# Patient Record
Sex: Female | Born: 1980 | Race: Black or African American | Hispanic: No | Marital: Married | Smoking: Never smoker
Health system: Southern US, Community
[De-identification: ages and names within clinical notes are randomized; demographics above are authoritative.]

## PROBLEM LIST (undated history)

## (undated) DIAGNOSIS — M419 Scoliosis, unspecified: Secondary | ICD-10-CM

## (undated) DIAGNOSIS — D649 Anemia, unspecified: Secondary | ICD-10-CM

## (undated) HISTORY — PX: DILATION AND CURETTAGE OF UTERUS: SHX78

## (undated) HISTORY — PX: BACK SURGERY: SHX140

---

## 2010-03-14 ENCOUNTER — Inpatient Hospital Stay (HOSPITAL_COMMUNITY): Admission: AD | Admit: 2010-03-14 | Discharge: 2010-03-18 | Payer: Self-pay | Admitting: Obstetrics and Gynecology

## 2010-10-28 LAB — CBC
HCT: 36.5 % (ref 36.0–46.0)
Hemoglobin: 12.2 g/dL (ref 12.0–15.0)
Hemoglobin: 9.9 g/dL — ABNORMAL LOW (ref 12.0–15.0)
MCH: 30.3 pg (ref 26.0–34.0)
MCV: 88.6 fL (ref 78.0–100.0)
MCV: 89.5 fL (ref 78.0–100.0)
RBC: 3.27 MIL/uL — ABNORMAL LOW (ref 3.87–5.11)
RBC: 4.12 MIL/uL (ref 3.87–5.11)
WBC: 5.9 10*3/uL (ref 4.0–10.5)

## 2017-10-02 ENCOUNTER — Other Ambulatory Visit: Payer: Self-pay

## 2017-10-05 ENCOUNTER — Other Ambulatory Visit (HOSPITAL_COMMUNITY): Payer: Self-pay | Admitting: Obstetrics and Gynecology

## 2017-10-11 ENCOUNTER — Ambulatory Visit (HOSPITAL_COMMUNITY)
Admission: RE | Admit: 2017-10-11 | Discharge: 2017-10-11 | Disposition: A | Discharge status: Home / Self Care | Payer: BC Managed Care – PPO | Source: Ambulatory Visit | Attending: Obstetrics and Gynecology | Admitting: Obstetrics and Gynecology

## 2017-10-11 ENCOUNTER — Encounter (HOSPITAL_COMMUNITY): Payer: Self-pay | Admitting: *Deleted

## 2017-10-11 DIAGNOSIS — O09529 Supervision of elderly multigravida, unspecified trimester: Secondary | ICD-10-CM

## 2017-10-11 HISTORY — DX: Scoliosis, unspecified: M41.9

## 2017-10-11 HISTORY — DX: Anemia, unspecified: D64.9

## 2017-10-15 DIAGNOSIS — O09529 Supervision of elderly multigravida, unspecified trimester: Secondary | ICD-10-CM | POA: Insufficient documentation

## 2017-10-15 DIAGNOSIS — Z3A15 15 weeks gestation of pregnancy: Secondary | ICD-10-CM | POA: Insufficient documentation

## 2017-10-15 NOTE — Progress Notes (Signed)
Genetic Counseling  High-Risk Gestation Note  Appointment Date:  10/15/2017 Referred By: Servando Salina, MD Date of Birth:  1981/02/23 Partner:  Savannah Hart.    Pregnancy History: G3P1011 Estimated Date of Delivery: 04/01/18 Estimated Gestational Age: [redacted]w[redacted]d Attending: Renella Cunas, MD   Savannah Hart and her husband, Mr. Zettie Gootee., were seen for genetic counseling because of a maternal age of 37 y.o. and reported NIPS high risk due to low fetal fraction.    In summary:  Discussed AMA and associated risk for fetal aneuploidy  NIPS ([facility] through [place]) identified low fetal fraction  1 in 17 risk for underlying fetal aneuploidy (T18, T13, Triploidy) according to [employer] al (2018)  Specific analysis for aneuploidy in current pregnancy not performed on cell free DNA given low fetal fraction  Patient had First screen which was within normal limits  Reduced fetal Down syndrome risk to 1 in 632  Reduced fetal T18/13 risk to 1 in >10,000  Reviewed differences in sensitivity and specificity of First screen and NIPS  Discussed options for screening  NIPS repeat to same laboratory given that fetal fraction increases with increasing gestational age- couple declined  Ultrasound in second trimester  Discussed diagnostic testing options  Amniocentesis- declined  Reviewed family history concerns  They were counseled regarding maternal age and the association with risk for chromosome conditions due to nondisjunction with aging of the ova.   We reviewed chromosomes, nondisjunction, and the associated risk for fetal aneuploidy related to a maternal age of 37 y.o. at [redacted]w[redacted]d gestation.  They were counseled that the risk for aneuploidy decreases as gestational age increases, accounting for those pregnancies which spontaneously abort.  We specifically discussed Down syndrome (trisomy 89), trisomies 74 and 74, and sex chromosome aneuploidies (47,XXX and 47,XXY)  including the common features and prognoses of each.   Savannah Hart previously had noninvasive prenatal screening (NIPS)/prenatal cell free DNA testing performed through her OB provider. This screening, specifically [facility] through [facility] laboratory identified low fetal fraction (3.9%), which is reported to be association with an increased risk for underlying fetal aneuploidy in pregnancy. Specifically, Johnsie Hart reports an associated 1 in 17 risk for underlying fetal aneuploidy (trisomy 63, trisomy 46, or triploidy). We reviewed the methodology of NIPS and discussed differential diagnoses for low fetal fraction including obesity, early gestational age, physiological differences in maternal blood, physiological differences in the placenta, and underlying fetal aneuploidy. Additionally, we discussed that there may be additional factors that impact fetal fraction in pregnancy that are not yet reported in the medical literature. When fetal fraction is on average less than 4%, [facility] has in recent years not been able to perform risk assessment for fetal aneuploidy conditions.  Recent outcome study data from pregnancies with low fetal fraction from [facility] laboratory (McKanna et al 2018), when not expected to be low based on maternal weight or gestational age, identified an increased risk for underlying fetal triploidy, trisomy 63, or trisomy 43, with a positive predictive value of approximately 1 in 63.   Savannah Hart had First trimester screening performed through her OB provider, which was within normal range for the conditions screened. We reviewed the associated reduction in risks for fetal Down syndrome (1 in 172 to 1 in 632) and Trisomy 18/13 (1 in 304 to 1 in >10,000). We reviewed that screens are used to modify a patient's a priori risk for aneuploidy, typically based on age in order to provide a pregnancy specific risk assessment. We reviewed the  sensitivity and specificity of First screen. They  understand that this screen is not diagnostic and does not assess for all chromosome conditions.   We reviewed additional available screening options including repeat NIPS and detailed ultrasound. We also reviewed the option of repeat NIPS given that fetal fraction is typically expected to increase throughout gestation, though discussed the possibility of not obtaining a result due to low fetal fraction. We reviewed the benefits and limitations of each option. Specifically, we discussed the conditions for which each test screens, the detection rates, and false positive rates of each. She was also counseled regarding diagnostic testing via amniocentesis. We reviewed the approximate 1 in 160-737 risk for complications from amniocentesis, including spontaneous pregnancy loss. We discussed the possible results that the tests might provide including: positive, negative, unanticipated, and no result. Finally, they were counseled regarding the cost of each option and potential out of pocket expenses.   After consideration of all the options, Savannah Hart declined amniocentesis, stating that they would not elect to pursue this testing in pregnancy given the associated risk for complications, and they declined repeat NIPS at this time. Detailed ultrasound is planned to be performed through her OB provider. She understands that screening tests cannot rule out all birth defects or genetic syndromes. The patient was advised of this limitation and states she still does not want additional testing at this time.    Savannah Hart was provided with written information regarding cystic fibrosis (CF), spinal muscular atrophy (SMA) and hemoglobinopathies including the carrier frequency, availability of carrier screening and prenatal diagnosis if indicated.  In addition, we discussed that CF and hemoglobinopathies are routinely screened for as part of the [place] newborn screening panel.  After further discussion, she  declined screening for CF, SMA and hemoglobinopathies.  Both family histories were reviewed and found to be contributory for a paternal half-uncle to Mr. Chevere (his father's maternal half-brother) with mild intellectual disability of unknown etiology. He reportedly was married and employed. He was not described to have dysmorphic features, and he died in his late 53's due to pneumonia. No additional relatives were reported with intellectual disability. They were counseled that there are many different causes of intellectual disabilities including environmental, multifactorial, and genetic etiologies.  We discussed that a specific diagnosis for intellectual disability can be determined in approximately 50% of these individuals.  In the remaining 50% of individuals, a diagnosis may never be determined.  Regarding genetic causes, we discussed that chromosome aberrations (aneuploidy, deletions, duplications, insertions, and translocations) are responsible for a small percentage of individuals with intellectual disability.  Many individuals with chromosome aberrations have additional differences, including congenital anomalies or minor dysmorphisms.  Likewise, single gene conditions are the underlying cause of intellectual delay in some families.  We discussed that many gene conditions have intellectual disability as a feature, but also often include other physical or medical differences.  We discussed that without more specific information, it is difficult to provide an accurate risk assessment.  Further genetic counseling is warranted if more information is obtained.  Mrs. Dunstan denied exposure to environmental toxins or chemical agents. She denied the use of alcohol, tobacco or street drugs. She denied significant viral illnesses during the course of her pregnancy.   I counseled this couple regarding the above risks and available options.  The approximate face-to-face time with the genetic counselor was 40  minutes.  Chipper Oman, MS,  Certified Genetic Counselor 10/15/2017

## 2017-10-16 ENCOUNTER — Encounter (HOSPITAL_COMMUNITY): Payer: Self-pay

## 2017-10-16 ENCOUNTER — Other Ambulatory Visit: Payer: Self-pay

## 2017-10-19 ENCOUNTER — Other Ambulatory Visit (HOSPITAL_COMMUNITY): Payer: Self-pay | Admitting: Obstetrics and Gynecology

## 2017-10-19 DIAGNOSIS — Z3A18 18 weeks gestation of pregnancy: Secondary | ICD-10-CM

## 2017-10-19 DIAGNOSIS — Z3689 Encounter for other specified antenatal screening: Secondary | ICD-10-CM

## 2017-10-19 DIAGNOSIS — O09522 Supervision of elderly multigravida, second trimester: Secondary | ICD-10-CM

## 2017-10-19 DIAGNOSIS — O289 Unspecified abnormal findings on antenatal screening of mother: Secondary | ICD-10-CM

## 2017-10-30 ENCOUNTER — Encounter (HOSPITAL_COMMUNITY): Payer: Self-pay

## 2017-10-30 ENCOUNTER — Other Ambulatory Visit (HOSPITAL_COMMUNITY): Payer: Self-pay | Admitting: *Deleted

## 2017-10-30 ENCOUNTER — Ambulatory Visit (HOSPITAL_COMMUNITY)
Admission: RE | Admit: 2017-10-30 | Discharge: 2017-10-30 | Disposition: A | Discharge status: Home / Self Care | Payer: BC Managed Care – PPO | Source: Ambulatory Visit | Attending: Obstetrics and Gynecology | Admitting: Obstetrics and Gynecology

## 2017-10-30 DIAGNOSIS — O289 Unspecified abnormal findings on antenatal screening of mother: Secondary | ICD-10-CM

## 2017-10-30 DIAGNOSIS — O09522 Supervision of elderly multigravida, second trimester: Secondary | ICD-10-CM

## 2017-10-30 DIAGNOSIS — Z363 Encounter for antenatal screening for malformations: Secondary | ICD-10-CM | POA: Diagnosis present

## 2017-10-30 DIAGNOSIS — Z3689 Encounter for other specified antenatal screening: Secondary | ICD-10-CM

## 2017-10-30 DIAGNOSIS — Z362 Encounter for other antenatal screening follow-up: Secondary | ICD-10-CM

## 2017-10-30 DIAGNOSIS — Z3A18 18 weeks gestation of pregnancy: Secondary | ICD-10-CM

## 2017-11-28 ENCOUNTER — Encounter (HOSPITAL_COMMUNITY): Payer: Self-pay

## 2017-11-28 ENCOUNTER — Ambulatory Visit (HOSPITAL_COMMUNITY): Payer: BC Managed Care – PPO

## 2018-03-22 ENCOUNTER — Inpatient Hospital Stay (HOSPITAL_COMMUNITY)
Admission: AD | Admit: 2018-03-22 | Discharge: 2018-03-25 | DRG: 787 | Disposition: A | Discharge status: Home / Self Care | Payer: BC Managed Care – PPO | Attending: Obstetrics and Gynecology | Admitting: Obstetrics and Gynecology

## 2018-03-22 ENCOUNTER — Encounter (HOSPITAL_COMMUNITY)
Admission: AD | Disposition: A | Discharge status: Home / Self Care | Payer: Self-pay | Source: Home / Self Care | Attending: Obstetrics and Gynecology

## 2018-03-22 ENCOUNTER — Encounter (HOSPITAL_COMMUNITY): Payer: Self-pay | Admitting: *Deleted

## 2018-03-22 ENCOUNTER — Other Ambulatory Visit: Payer: Self-pay | Admitting: Obstetrics and Gynecology

## 2018-03-22 ENCOUNTER — Inpatient Hospital Stay (HOSPITAL_COMMUNITY): Payer: BC Managed Care – PPO | Admitting: Anesthesiology

## 2018-03-22 ENCOUNTER — Other Ambulatory Visit: Payer: Self-pay

## 2018-03-22 DIAGNOSIS — O99824 Streptococcus B carrier state complicating childbirth: Secondary | ICD-10-CM | POA: Diagnosis present

## 2018-03-22 DIAGNOSIS — O34211 Maternal care for low transverse scar from previous cesarean delivery: Secondary | ICD-10-CM | POA: Diagnosis present

## 2018-03-22 DIAGNOSIS — O9081 Anemia of the puerperium: Secondary | ICD-10-CM | POA: Diagnosis not present

## 2018-03-22 DIAGNOSIS — O9902 Anemia complicating childbirth: Secondary | ICD-10-CM | POA: Diagnosis not present

## 2018-03-22 DIAGNOSIS — D259 Leiomyoma of uterus, unspecified: Secondary | ICD-10-CM | POA: Diagnosis present

## 2018-03-22 DIAGNOSIS — O34219 Maternal care for unspecified type scar from previous cesarean delivery: Secondary | ICD-10-CM | POA: Diagnosis present

## 2018-03-22 DIAGNOSIS — Z3A38 38 weeks gestation of pregnancy: Secondary | ICD-10-CM

## 2018-03-22 DIAGNOSIS — O365930 Maternal care for other known or suspected poor fetal growth, third trimester, not applicable or unspecified: Secondary | ICD-10-CM | POA: Diagnosis present

## 2018-03-22 DIAGNOSIS — D62 Acute posthemorrhagic anemia: Secondary | ICD-10-CM | POA: Diagnosis not present

## 2018-03-22 DIAGNOSIS — O3413 Maternal care for benign tumor of corpus uteri, third trimester: Secondary | ICD-10-CM | POA: Diagnosis present

## 2018-03-22 DIAGNOSIS — O365931 Maternal care for other known or suspected poor fetal growth, third trimester, fetus 1: Secondary | ICD-10-CM | POA: Diagnosis present

## 2018-03-22 LAB — TYPE AND SCREEN
ABO/RH(D): B POS
ANTIBODY SCREEN: NEGATIVE

## 2018-03-22 LAB — CBC
HEMATOCRIT: 31.1 % — AB (ref 36.0–46.0)
Hemoglobin: 10.1 g/dL — ABNORMAL LOW (ref 12.0–15.0)
MCH: 25.5 pg — ABNORMAL LOW (ref 26.0–34.0)
MCHC: 32.5 g/dL (ref 30.0–36.0)
MCV: 78.5 fL (ref 78.0–100.0)
PLATELETS: 259 10*3/uL (ref 150–400)
RBC: 3.96 MIL/uL (ref 3.87–5.11)
RDW: 15.9 % — ABNORMAL HIGH (ref 11.5–15.5)
WBC: 5.6 10*3/uL (ref 4.0–10.5)

## 2018-03-22 LAB — ABO/RH: ABO/RH(D): B POS

## 2018-03-22 SURGERY — Surgical Case
Anesthesia: Spinal

## 2018-03-22 MED ORDER — CEFAZOLIN SODIUM-DEXTROSE 2-4 GM/100ML-% IV SOLN
2.0000 g | INTRAVENOUS | Status: AC
  Administered 2018-03-22: 2 g via INTRAVENOUS
  Filled 2018-03-22: qty 100

## 2018-03-22 MED ORDER — MEPERIDINE HCL 25 MG/ML IJ SOLN: 6.2500 mg | INTRAMUSCULAR | Status: DC | PRN

## 2018-03-22 MED ORDER — LACTATED RINGERS IV SOLN
INTRAVENOUS | Status: DC
  Administered 2018-03-23: 03:00:00 via INTRAVENOUS

## 2018-03-22 MED ORDER — FENTANYL CITRATE (PF) 100 MCG/2ML IJ SOLN: 25.0000 ug | INTRAMUSCULAR | Status: DC | PRN

## 2018-03-22 MED ORDER — KETOROLAC TROMETHAMINE 30 MG/ML IJ SOLN
30.0000 mg | Freq: Four times a day (QID) | INTRAMUSCULAR | Status: DC | PRN
  Administered 2018-03-22: 30 mg via INTRAMUSCULAR

## 2018-03-22 MED ORDER — NALBUPHINE HCL 10 MG/ML IJ SOLN: 5.0000 mg | INTRAMUSCULAR | Status: DC | PRN

## 2018-03-22 MED ORDER — DIPHENHYDRAMINE HCL 25 MG PO CAPS: 25.0000 mg | ORAL_CAPSULE | Freq: Four times a day (QID) | ORAL | Status: DC | PRN

## 2018-03-22 MED ORDER — ZOLPIDEM TARTRATE 5 MG PO TABS: 5.0000 mg | ORAL_TABLET | Freq: Every evening | ORAL | Status: DC | PRN

## 2018-03-22 MED ORDER — SIMETHICONE 80 MG PO CHEW
80.0000 mg | CHEWABLE_TABLET | ORAL | Status: DC
  Administered 2018-03-22 – 2018-03-23 (×2): 80 mg via ORAL
  Filled 2018-03-22 (×2): qty 1

## 2018-03-22 MED ORDER — PHENYLEPHRINE 8 MG IN D5W 100 ML (0.08MG/ML) PREMIX OPTIME
INJECTION | INTRAVENOUS | Status: AC
  Filled 2018-03-22: qty 100

## 2018-03-22 MED ORDER — FENTANYL CITRATE (PF) 100 MCG/2ML IJ SOLN
INTRAMUSCULAR | Status: AC
  Filled 2018-03-22: qty 2

## 2018-03-22 MED ORDER — PROMETHAZINE HCL 25 MG/ML IJ SOLN: 6.2500 mg | INTRAMUSCULAR | Status: DC | PRN

## 2018-03-22 MED ORDER — MENTHOL 3 MG MT LOZG: 1.0000 | LOZENGE | OROMUCOSAL | Status: DC | PRN

## 2018-03-22 MED ORDER — ONDANSETRON HCL 4 MG/2ML IJ SOLN: 4.0000 mg | Freq: Three times a day (TID) | INTRAMUSCULAR | Status: DC | PRN

## 2018-03-22 MED ORDER — KETOROLAC TROMETHAMINE 30 MG/ML IJ SOLN: 30.0000 mg | Freq: Four times a day (QID) | INTRAMUSCULAR | Status: DC | PRN

## 2018-03-22 MED ORDER — SCOPOLAMINE 1 MG/3DAYS TD PT72
1.0000 | MEDICATED_PATCH | Freq: Once | TRANSDERMAL | Status: AC
  Administered 2018-03-22: 1.5 mg via TRANSDERMAL
  Filled 2018-03-22: qty 1

## 2018-03-22 MED ORDER — OXYTOCIN 10 UNIT/ML IJ SOLN
INTRAMUSCULAR | Status: AC
  Filled 2018-03-22: qty 4

## 2018-03-22 MED ORDER — IBUPROFEN 600 MG PO TABS
600.0000 mg | ORAL_TABLET | Freq: Four times a day (QID) | ORAL | Status: DC
  Administered 2018-03-22 – 2018-03-25 (×10): 600 mg via ORAL
  Filled 2018-03-22 (×10): qty 1

## 2018-03-22 MED ORDER — LACTATED RINGERS IV SOLN
INTRAVENOUS | Status: DC
  Administered 2018-03-22 (×3): via INTRAVENOUS

## 2018-03-22 MED ORDER — DIPHENHYDRAMINE HCL 50 MG/ML IJ SOLN: 12.5000 mg | INTRAMUSCULAR | Status: DC | PRN

## 2018-03-22 MED ORDER — PRENATAL MULTIVITAMIN CH
1.0000 | ORAL_TABLET | Freq: Every day | ORAL | Status: DC
  Administered 2018-03-23 – 2018-03-24 (×2): 1 via ORAL
  Filled 2018-03-22 (×2): qty 1

## 2018-03-22 MED ORDER — WITCH HAZEL-GLYCERIN EX PADS: 1.0000 "application " | MEDICATED_PAD | CUTANEOUS | Status: DC | PRN

## 2018-03-22 MED ORDER — SODIUM CHLORIDE 0.9% FLUSH: 3.0000 mL | INTRAVENOUS | Status: DC | PRN

## 2018-03-22 MED ORDER — COCONUT OIL OIL: 1.0000 "application " | TOPICAL_OIL | Status: DC | PRN

## 2018-03-22 MED ORDER — DIBUCAINE 1 % RE OINT
1.0000 | TOPICAL_OINTMENT | RECTAL | Status: DC | PRN
Start: 2018-03-22 — End: 2018-03-25

## 2018-03-22 MED ORDER — DIPHENHYDRAMINE HCL 25 MG PO CAPS
25.0000 mg | ORAL_CAPSULE | ORAL | Status: DC | PRN
  Filled 2018-03-22: qty 1

## 2018-03-22 MED ORDER — MORPHINE SULFATE (PF) 0.5 MG/ML IJ SOLN
INTRAMUSCULAR | Status: AC
  Filled 2018-03-22: qty 10

## 2018-03-22 MED ORDER — SIMETHICONE 80 MG PO CHEW
80.0000 mg | CHEWABLE_TABLET | Freq: Three times a day (TID) | ORAL | Status: DC
Start: 2018-03-22 — End: 2018-03-25
  Administered 2018-03-23 – 2018-03-24 (×4): 80 mg via ORAL
  Filled 2018-03-22 (×4): qty 1

## 2018-03-22 MED ORDER — KETOROLAC TROMETHAMINE 30 MG/ML IJ SOLN
INTRAMUSCULAR | Status: AC
  Filled 2018-03-22: qty 1

## 2018-03-22 MED ORDER — OXYTOCIN 40 UNITS IN LACTATED RINGERS INFUSION - SIMPLE MED: 2.5000 [IU]/h | INTRAVENOUS | Status: AC

## 2018-03-22 MED ORDER — OXYCODONE-ACETAMINOPHEN 5-325 MG PO TABS
2.0000 | ORAL_TABLET | ORAL | Status: DC | PRN
  Administered 2018-03-24: 2 via ORAL
  Filled 2018-03-22: qty 2

## 2018-03-22 MED ORDER — OXYCODONE-ACETAMINOPHEN 5-325 MG PO TABS
1.0000 | ORAL_TABLET | ORAL | Status: DC | PRN
  Administered 2018-03-23 – 2018-03-25 (×7): 1 via ORAL
  Filled 2018-03-22 (×7): qty 1

## 2018-03-22 MED ORDER — NALOXONE HCL 4 MG/10ML IJ SOLN
1.0000 ug/kg/h | INTRAVENOUS | Status: DC | PRN
  Filled 2018-03-22: qty 5

## 2018-03-22 MED ORDER — PHENYLEPHRINE 8 MG IN D5W 100 ML (0.08MG/ML) PREMIX OPTIME
INJECTION | INTRAVENOUS | Status: DC | PRN
  Administered 2018-03-22: 60 ug/min via INTRAVENOUS

## 2018-03-22 MED ORDER — ONDANSETRON HCL 4 MG/2ML IJ SOLN
INTRAMUSCULAR | Status: DC | PRN
  Administered 2018-03-22: 4 mg via INTRAVENOUS

## 2018-03-22 MED ORDER — BUPIVACAINE HCL (PF) 0.25 % IJ SOLN
INTRAMUSCULAR | Status: DC | PRN
  Administered 2018-03-22: 10 mL

## 2018-03-22 MED ORDER — SOD CITRATE-CITRIC ACID 500-334 MG/5ML PO SOLN
30.0000 mL | Freq: Once | ORAL | Status: AC
  Administered 2018-03-22: 30 mL via ORAL
  Filled 2018-03-22: qty 15

## 2018-03-22 MED ORDER — ONDANSETRON HCL 4 MG/2ML IJ SOLN
INTRAMUSCULAR | Status: AC
  Filled 2018-03-22: qty 2

## 2018-03-22 MED ORDER — SENNOSIDES-DOCUSATE SODIUM 8.6-50 MG PO TABS
2.0000 | ORAL_TABLET | ORAL | Status: DC
  Administered 2018-03-22 – 2018-03-23 (×2): 2 via ORAL
  Filled 2018-03-22 (×2): qty 2

## 2018-03-22 MED ORDER — OXYTOCIN 10 UNIT/ML IJ SOLN
INTRAVENOUS | Status: DC | PRN
  Administered 2018-03-22: 40 [IU] via INTRAVENOUS

## 2018-03-22 MED ORDER — NALBUPHINE HCL 10 MG/ML IJ SOLN: 5.0000 mg | Freq: Once | INTRAMUSCULAR | Status: AC | PRN

## 2018-03-22 MED ORDER — SIMETHICONE 80 MG PO CHEW: 80.0000 mg | CHEWABLE_TABLET | ORAL | Status: DC | PRN

## 2018-03-22 MED ORDER — NALBUPHINE HCL 10 MG/ML IJ SOLN
5.0000 mg | Freq: Once | INTRAMUSCULAR | Status: AC | PRN
  Administered 2018-03-22: 5 mg via SUBCUTANEOUS

## 2018-03-22 MED ORDER — NALBUPHINE HCL 10 MG/ML IJ SOLN
INTRAMUSCULAR | Status: AC
  Filled 2018-03-22: qty 1

## 2018-03-22 MED ORDER — LACTATED RINGERS IV SOLN: INTRAVENOUS | Status: DC

## 2018-03-22 MED ORDER — NALOXONE HCL 0.4 MG/ML IJ SOLN: 0.4000 mg | INTRAMUSCULAR | Status: DC | PRN

## 2018-03-22 MED ORDER — BUPIVACAINE IN DEXTROSE 0.75-8.25 % IT SOLN
INTRATHECAL | Status: DC | PRN
  Administered 2018-03-22: 1.6 mg via INTRATHECAL

## 2018-03-22 MED ORDER — FENTANYL CITRATE (PF) 100 MCG/2ML IJ SOLN
INTRAMUSCULAR | Status: DC | PRN
  Administered 2018-03-22: 10 ug via INTRATHECAL
  Administered 2018-03-22: 50 ug via INTRAVENOUS

## 2018-03-22 MED ORDER — MORPHINE SULFATE (PF) 0.5 MG/ML IJ SOLN
INTRAMUSCULAR | Status: DC | PRN
  Administered 2018-03-22: .2 mg via INTRATHECAL

## 2018-03-22 MED ORDER — BUPIVACAINE HCL (PF) 0.5 % IJ SOLN
INTRAMUSCULAR | Status: AC
  Filled 2018-03-22: qty 30

## 2018-03-22 SURGICAL SUPPLY — 45 items
BARRIER ADHS 3X4 INTERCEED (GAUZE/BANDAGES/DRESSINGS) ×3 IMPLANT
BENZOIN TINCTURE PRP APPL 2/3 (GAUZE/BANDAGES/DRESSINGS) ×3 IMPLANT
CHLORAPREP W/TINT 26ML (MISCELLANEOUS) ×3 IMPLANT
CLAMP CORD UMBIL (MISCELLANEOUS) IMPLANT
CLOSURE STERI STRIP 1/2 X4 (GAUZE/BANDAGES/DRESSINGS) ×3 IMPLANT
CLOSURE WOUND 1/2 X4 (GAUZE/BANDAGES/DRESSINGS)
CLOTH BEACON ORANGE TIMEOUT ST (SAFETY) ×3 IMPLANT
DRAPE C SECTION CLR SCREEN (DRAPES) ×3 IMPLANT
DRSG OPSITE POSTOP 4X10 (GAUZE/BANDAGES/DRESSINGS) ×3 IMPLANT
ELECT REM PT RETURN 9FT ADLT (ELECTROSURGICAL) ×3
ELECTRODE REM PT RTRN 9FT ADLT (ELECTROSURGICAL) ×1 IMPLANT
EXTRACTOR VACUUM M CUP 4 TUBE (SUCTIONS) IMPLANT
EXTRACTOR VACUUM M CUP 4' TUBE (SUCTIONS)
GLOVE BIOGEL PI IND STRL 7.0 (GLOVE) ×2 IMPLANT
GLOVE BIOGEL PI INDICATOR 7.0 (GLOVE) ×4
GLOVE ECLIPSE 6.5 STRL STRAW (GLOVE) ×3 IMPLANT
GOWN STRL REUS W/TWL LRG LVL3 (GOWN DISPOSABLE) ×6 IMPLANT
KIT ABG SYR 3ML LUER SLIP (SYRINGE) IMPLANT
NEEDLE HYPO 22GX1.5 SAFETY (NEEDLE) ×3 IMPLANT
NEEDLE HYPO 25X5/8 SAFETYGLIDE (NEEDLE) IMPLANT
NS IRRIG 1000ML POUR BTL (IV SOLUTION) ×3 IMPLANT
PACK C SECTION WH (CUSTOM PROCEDURE TRAY) ×3 IMPLANT
PAD OB MATERNITY 4.3X12.25 (PERSONAL CARE ITEMS) ×3 IMPLANT
RTRCTR C-SECT PINK 25CM LRG (MISCELLANEOUS) IMPLANT
SPONGE LAP 18X18 X RAY DECT (DISPOSABLE) ×3 IMPLANT
STRIP CLOSURE SKIN 1/2X4 (GAUZE/BANDAGES/DRESSINGS) IMPLANT
SUT CHROMIC GUT AB #0 18 (SUTURE) IMPLANT
SUT MNCRL 0 VIOLET CTX 36 (SUTURE) ×3 IMPLANT
SUT MON AB 2-0 SH 27 (SUTURE)
SUT MON AB 2-0 SH27 (SUTURE) IMPLANT
SUT MON AB 3-0 SH 27 (SUTURE)
SUT MON AB 3-0 SH27 (SUTURE) IMPLANT
SUT MON AB 4-0 PS1 27 (SUTURE) IMPLANT
SUT MONOCRYL 0 CTX 36 (SUTURE) ×6
SUT PLAIN 2 0 (SUTURE)
SUT PLAIN 2 0 XLH (SUTURE) IMPLANT
SUT PLAIN ABS 2-0 CT1 27XMFL (SUTURE) IMPLANT
SUT VIC AB 0 CT1 36 (SUTURE) ×6 IMPLANT
SUT VIC AB 2-0 CT1 (SUTURE) ×3 IMPLANT
SUT VIC AB 2-0 CT1 27 (SUTURE) ×2
SUT VIC AB 2-0 CT1 TAPERPNT 27 (SUTURE) ×1 IMPLANT
SUT VIC AB 4-0 PS2 27 (SUTURE) IMPLANT
SYR CONTROL 10ML LL (SYRINGE) ×3 IMPLANT
TOWEL OR 17X24 6PK STRL BLUE (TOWEL DISPOSABLE) ×3 IMPLANT
TRAY FOLEY W/BAG SLVR 14FR LF (SET/KITS/TRAYS/PACK) IMPLANT

## 2018-03-22 NOTE — H&P (Addendum)
Savannah Hart is a 37 y.o. female presenting for  Repeat C/S @ term due to IUGR noted on sonogram done yesterday( 5lb 14 oz) 8%, nl dopplers, nl fluid. BPP 8/8 OB History    Gravida  3   Para  1   Term  1   Preterm      AB  1   Living  1     SAB      TAB  1   Ectopic      Multiple      Live Births             Past Medical History:  Diagnosis Date  . Anemia   . Scoliosis    Past Surgical History:  Procedure Laterality Date  . BACK SURGERY    . CESAREAN SECTION    . DILATION AND CURETTAGE OF UTERUS     Family History: family history is not on file. Social History:  reports that she has never smoked. She has never used smokeless tobacco. She reports that she does not drink alcohol or use drugs.     Maternal Diabetes: No Genetic Screening: Abnormal:  Results: Other:low NIPT Maternal Ultrasounds/Referrals: echogenic cardiac foci Fetal Ultrasounds or other Referrals:  Referred to Materal Fetal Medicine  Maternal Substance Abuse:  No Significant Maternal Medications:  None Significant Maternal Lab Results:  Lab values include: Group B Strep positive.  Other Comments:  prev C/S. IUGR  Review of Systems  All other systems reviewed and are negative.  Maternal Medical History:  Contractions: Frequency: regular.    Fetal activity: Perceived fetal activity is normal.   Last perceived fetal movement was within the past hour.    Prenatal complications: IUGR.   Prenatal Complications - Diabetes: none.      Last menstrual period 06/25/2017. Maternal Exam:  Uterine Assessment: Contraction frequency is irregular.   Abdomen: Patient reports no abdominal tenderness. Surgical scars: low transverse.   Fetal presentation: vertex  Pelvis: adequate for delivery.   Cervix: Cervix evaluated by digital exam.     Physical Exam  Constitutional: She is oriented to person, place, and time. She appears well-developed and well-nourished.  HENT:  Head: Atraumatic.   Eyes: EOM are normal.  Neck: Neck supple.  Cardiovascular: Regular rhythm.  Respiratory: Breath sounds normal.  GI: Soft.  Musculoskeletal: She exhibits edema.  Neurological: She is alert and oriented to person, place, and time.  Skin: Skin is warm and dry.  Psychiatric: She has a normal mood and affect.   VE 1/50/-3 Prenatal labs: ABO, Rh:  B positive Antibody:   neg Rubella:  Immune RPR:   NR HBsAg:   neg HIV:   neg GBS:   positive  Assessment/Plan: IUGR Previous C/S.  Term gestation P) repeat C/S. Risk of surgery includes infection, bleeding, injury to surrounding organ structures, internal scar tissue, poss need for blood transfusions and its risk( HIV, acute rxn, hepatitis).    Savannah Hart A Momodou Consiglio 03/22/2018, 2:16 AM

## 2018-03-22 NOTE — Progress Notes (Signed)
1830 Nursing:  Savannah Hart has been to breast for 30 min. Latch 7, assisted Mom with positions, latch, etc.  attempted to express colostrum, none visible at this time.  Baby  continues to root, and cry, Mom is frustrated and states she wants to feed the baby some formula.  I reviewed LEAD with parents, and showed them the positives, the baby had a strong latch, audible swallows noted, but baby was jittery and pulling off breast and crying.  Mom verbalized she would continue to put baby to breast, but did want to supplement at this time.  I got Neosure 22 Cal and syringe fed baby 7 ml over 15 minutes.  Baby did spit up a little (3 ml), but managed to keep down approx 4 ml

## 2018-03-22 NOTE — Anesthesia Preprocedure Evaluation (Addendum)
Anesthesia Evaluation  Patient identified by MRN, date of birth, ID band Patient awake    Reviewed: Allergy & Precautions, NPO status , Patient's Chart, lab work & pertinent test results  Airway Mallampati: III  TM Distance: >3 FB Neck ROM: Full    Dental  (+) Teeth Intact   Pulmonary neg pulmonary ROS,    breath sounds clear to auscultation       Cardiovascular  Rhythm:Regular Rate:Normal     Neuro/Psych negative neurological ROS     GI/Hepatic negative GI ROS, Neg liver ROS,   Endo/Other    Renal/GU      Musculoskeletal negative musculoskeletal ROS (+)   Abdominal (+) + obese,   Peds  Hematology negative hematology ROS (+)   Anesthesia Other Findings   Reproductive/Obstetrics (+) Pregnancy                            Anesthesia Physical Anesthesia Plan  ASA: II  Anesthesia Plan: Spinal   Post-op Pain Management:    Induction:   PONV Risk Score and Plan: 3 and Ondansetron and Treatment may vary due to age or medical condition  Airway Management Planned: Natural Airway  Additional Equipment: None  Intra-op Plan:   Post-operative Plan:   Informed Consent: I have reviewed the patients History and Physical, chart, labs and discussed the procedure including the risks, benefits and alternatives for the proposed anesthesia with the patient or authorized representative who has indicated his/her understanding and acceptance.     Plan Discussed with: CRNA  Anesthesia Plan Comments: (Negative Blood antibodies verified in chart from testing in 08/2017.  )      Anesthesia Quick Evaluation

## 2018-03-22 NOTE — Transfer of Care (Signed)
Immediate Anesthesia Transfer of Care Note  Patient: Savannah Hart  Procedure(s) Performed: Repeat CESAREAN SECTION (N/A )  Patient Location: PACU  Anesthesia Type:Spinal  Level of Consciousness: awake and alert   Airway & Oxygen Therapy: Patient Spontanous Breathing  Post-op Assessment: Report given to RN and Post -op Vital signs reviewed and stable  Post vital signs: Reviewed  Last Vitals:  Vitals Value Taken Time  BP    Temp    Pulse 57 03/22/2018  2:00 PM  Resp 11 03/22/2018  2:00 PM  SpO2 100 % 03/22/2018  2:00 PM  Vitals shown include unvalidated device data.  Last Pain:  Vitals:   03/22/18 1054  TempSrc: Oral  PainSc: 0-No pain         Complications: No apparent anesthesia complications

## 2018-03-22 NOTE — Brief Op Note (Signed)
03/22/2018  1:50 PM  PATIENT:  Savannah Hart  37 y.o. female  PRE-OPERATIVE DIAGNOSIS:  Previous Cesarean Section, IUGR, term gestation  POST-OPERATIVE DIAGNOSIS:  Previous Cesarean Section, IUGR, term gestation, SS fibroids  PROCEDURE:  Repeat cesarean section, kerr hysterotomy  SURGEON:  Surgeon(s) and Role:    * Ward Boissonneault, Alanda Slim, MD - Primary  PHYSICIAN ASSISTANT:   ASSISTANTS: none   ANESTHESIA:   spinal Findings: live female CAN x 2, nl tubes and ovaries, SS ant fibroids, omental adhesions to ant abdominal wall. 5lb 2 oz, Apgar 9/9 EBL:  830 mL   BLOOD ADMINISTERED:none  DRAINS: none   LOCAL MEDICATIONS USED:  MARCAINE     SPECIMEN:  Source of Specimen:  placenta  DISPOSITION OF SPECIMEN:  N/A  COUNTS:  YES  TOURNIQUET:  * No tourniquets in log *  DICTATION: .Other Dictation: Dictation Number [id number]  PLAN OF CARE: Admit to inpatient   PATIENT DISPOSITION:  PACU - hemodynamically stable.   Delay start of Pharmacological VTE agent (>24hrs) due to surgical blood loss or risk of bleeding: no

## 2018-03-22 NOTE — Anesthesia Procedure Notes (Addendum)
Spinal  Patient location during procedure: OR Start time: 03/22/2018 12:24 PM End time: 03/22/2018 12:28 PM Staffing Anesthesiologist: Hollis, Kevin D, MD Performed: anesthesiologist  Preanesthetic Checklist Completed: patient identified, site marked, surgical consent, pre-op evaluation, timeout performed, IV checked, risks and benefits discussed and monitors and equipment checked Spinal Block Patient position: sitting Prep: DuraPrep Patient monitoring: heart rate, continuous pulse ox, blood pressure and cardiac monitor Approach: midline Location: L4-5 Injection technique: single-shot Needle Needle type: Introducer and Pencan  Needle gauge: 24 G Needle length: 9 cm Additional Notes Negative paresthesia. Negative blood return. Positive free-flowing CSF. Expiration date of kit checked and confirmed. Patient tolerated procedure well, without complications.  h/o Back surgery, currently has lumbar scoliosis. Unable to aspirate easily. Free flow CSF after injection.       

## 2018-03-22 NOTE — Anesthesia Postprocedure Evaluation (Signed)
Anesthesia Post Note  Patient: Savannah Hart  Procedure(s) Performed: Repeat CESAREAN SECTION (N/A )     Patient location during evaluation: Mother Baby Anesthesia Type: Spinal Level of consciousness: awake and alert Pain management: pain level controlled Vital Signs Assessment: post-procedure vital signs reviewed and stable Respiratory status: spontaneous breathing, nonlabored ventilation and respiratory function stable Cardiovascular status: stable Postop Assessment: no headache, no backache, spinal receding, able to ambulate, adequate PO intake, no apparent nausea or vomiting and patient able to bend at knees Anesthetic complications: no    Last Vitals:  Vitals:   03/22/18 1730 03/22/18 1815  BP: 112/66 118/70  Pulse: (!) 58 62  Resp: 16 18  Temp: 36.7 C 36.7 C  SpO2: 99% 100%    Last Pain:  Vitals:   03/22/18 1815  TempSrc: Oral  PainSc:    Pain Goal:                 [employer]

## 2018-03-22 NOTE — Addendum Note (Signed)
Addendum  created 03/22/18 2026 by Hewitt Blade, CRNA   Sign clinical note

## 2018-03-22 NOTE — Anesthesia Postprocedure Evaluation (Signed)
Anesthesia Post Note  Patient: Savannah Hart  Procedure(s) Performed: Repeat CESAREAN SECTION (N/A )     Patient location during evaluation: PACU Anesthesia Type: Spinal Level of consciousness: oriented and awake and alert Pain management: pain level controlled Vital Signs Assessment: post-procedure vital signs reviewed and stable Respiratory status: spontaneous breathing, respiratory function stable and patient connected to nasal cannula oxygen Cardiovascular status: blood pressure returned to baseline and stable Postop Assessment: no headache, no backache, no apparent nausea or vomiting, spinal receding and patient able to bend at knees Anesthetic complications: no    Last Vitals:  Vitals:   03/22/18 1453 03/22/18 1454  BP:    Pulse: 68 68  Resp: 18 17  Temp:    SpO2: 100% 100%    Last Pain:  Vitals:   03/22/18 1054  TempSrc: Oral  PainSc: 0-No pain   Pain Goal:                 Effie Berkshire

## 2018-03-23 ENCOUNTER — Encounter (HOSPITAL_COMMUNITY): Payer: Self-pay | Admitting: Obstetrics and Gynecology

## 2018-03-23 DIAGNOSIS — O9902 Anemia complicating childbirth: Secondary | ICD-10-CM | POA: Diagnosis not present

## 2018-03-23 LAB — CBC
HCT: 26.2 % — ABNORMAL LOW (ref 36.0–46.0)
Hemoglobin: 8.8 g/dL — ABNORMAL LOW (ref 12.0–15.0)
MCH: 26.3 pg (ref 26.0–34.0)
MCHC: 33.6 g/dL (ref 30.0–36.0)
MCV: 78.2 fL (ref 78.0–100.0)
PLATELETS: 201 10*3/uL (ref 150–400)
RBC: 3.35 MIL/uL — AB (ref 3.87–5.11)
RDW: 15.8 % — ABNORMAL HIGH (ref 11.5–15.5)
WBC: 8.1 10*3/uL (ref 4.0–10.5)

## 2018-03-23 LAB — RPR: RPR: NONREACTIVE

## 2018-03-23 MED ORDER — SODIUM CHLORIDE 0.9 % IV SOLN
510.0000 mg | INTRAVENOUS | Status: DC
  Administered 2018-03-23: 510 mg via INTRAVENOUS
  Filled 2018-03-23: qty 17

## 2018-03-23 MED ORDER — DIPHENHYDRAMINE HCL 25 MG PO CAPS
25.0000 mg | ORAL_CAPSULE | Freq: Three times a day (TID) | ORAL | Status: DC | PRN
Start: 2018-03-23 — End: 2018-03-25
  Administered 2018-03-23: 25 mg via ORAL
  Filled 2018-03-23: qty 1

## 2018-03-23 NOTE — Progress Notes (Signed)
Subjective: POD# 1 Information for the patient's newborn:  [place], [place] [[id number]]  female  Baby name: Zuri  Reports feeling well Feeding: breast and bottle Patient reports tolerating PO.  Breast symptoms: none Pain controlled with PO meds Denies HA/SOB/C/P/N/V/dizziness. Flatus present. She reports vaginal bleeding as normal, without clots.  She is ambulating, urinating without difficulty.     Objective:   VS:    Vitals:   03/22/18 2224 03/23/18 0000 03/23/18 0100 03/23/18 0500  BP:   107/70 126/66  Pulse:   (!) 59 (!) 57  Resp: 20  18 17   Temp: 98.3 F (36.8 C)  98 F (36.7 C) 97.7 F (36.5 C)  TempSrc: Oral  Oral Oral  SpO2: 100% 100% 100% 100%  Weight:      Height:          Intake/Output Summary (Last 24 hours) at 03/23/2018 0930 Last data filed at 03/23/2018 0644 Gross per 24 hour  Intake 2200 ml  Output 3205 ml  Net -1005 ml        Recent Labs    03/22/18 1045 03/23/18 0535  WBC 5.6 8.1  HGB 10.1* 8.8*  HCT 31.1* 26.2*  PLT 259 201     Blood type: --/--/B POS, B POS Performed at [facility], [address], [place], [place] [address]  (08/09 1045)  Rubella:   immune    Physical Exam:  General: alert, cooperative and no distress CV: Regular rate and rhythm Resp: clear Abdomen: soft, nontender, normal bowel sounds Incision: clean, dry and intact Uterine Fundus: firm, below umbilicus, nontender Lochia: minimal Ext: extremities normal, atraumatic, no cyanosis or edema      Assessment/Plan: 37 y.o.   POD# 1. [id number]                  Principal Problem:   Postpartum care following cesarean delivery (8/9) Active Problems:   IUGR (intrauterine growth restriction) affecting care of mother, third trimester, fetus 1   Previous cesarean delivery affecting pregnancy, antepartum   Maternal anemia, with delivery  - IV iron infusion  Doing well, stable.               Advance diet as tolerated Encourage rest when baby  rests Breastfeeding support Encourage to ambulate Routine post-op care  Juliene Pina, CNM, MSN 03/23/2018, 9:30 AM

## 2018-03-23 NOTE — Lactation Note (Signed)
This note was copied from a baby's chart. Lactation Consultation Note Baby 29 hrs old. Attempted to visit mom a couple of times during the night, once in BR, the other time sleeping. Finally saw mom this morning. Discussed the BF. Mom stated going well, the baby doesn't want to get off the breast. Instructed not to feed baby longer than 30 min. Mom has supplemented baby. RN asked LC about supplementing, LC encouraged it and discussed reasoning and benefits. Mom in agreement. RN to set up DEBP. Encouraged mom to pump for 20 min. Every 3 hrs for supplementation. LPI information sheet given and reviewed for supplementation and conserving energy.  Mom has full heavy breast. Mom does have generalized edema to LE. Mom has short shaft compressible nipples. Hand expressed 2 ml thick colostrum. Milk storage reviewed.  Shells given to wear in bra today.  Newborn behavior, STS, I&O, cluster feeding, breast feeding positioning and support. Stressed importance of supply and demand. Mom gave baby 22 cal formula w/slow flow nipple. Baby tolerated well.  Mom BF her now 65 yr old for 3 months. Plan. Mom is to BF for 20-25 minutes Pump Give any colostrum Supplement w/22 cal Similac w/slow flow nipple Strict document I&O.  Noted the baby had fine tremors to hands. Mom and Father stated baby has been that way. Blood sugars wdl. Mom encouraged to feed baby 8-12 times/24 hours and with feeding cues. [place] brochure given w/resources, support groups and [place] services.   Patient Name: Girl Darlyne Schmiesing [id number] Date: 03/23/2018 Reason for consult: Initial assessment;Infant < 6lbs;Early term 37-38.6wks   Maternal Data Has patient been taught Hand Expression?: Yes Does the patient have breastfeeding experience prior to this delivery?: Yes  Feeding Feeding Type: Formula Nipple Type: Slow - flow Length of feed: 30 min  LATCH Score Latch: Repeated attempts needed to sustain latch, nipple held in mouth throughout  feeding, stimulation needed to elicit sucking reflex.  Audible Swallowing: Spontaneous and intermittent  Type of Nipple: Everted at rest and after stimulation(short shaft)  Comfort (Breast/Nipple): Soft / non-tender  Hold (Positioning): Assistance needed to correctly position infant at breast and maintain latch.  LATCH Score: 8  Interventions Interventions: Breast feeding basics reviewed;Support pillows;Position options;Skin to skin;Expressed milk;Breast massage;Hand express;Shells;Pre-pump if needed;Hand pump;DEBP;Breast compression;Adjust position  Lactation Tools Discussed/Used Tools: Pump;Shells Shell Type: Inverted Breast pump type: Double-Electric Breast Pump Pump Review: Setup, frequency, and cleaning;Milk Storage Initiated by:: RN(LC requested) Date initiated:: 03/23/18   Consult Status Consult Status: Follow-up Date: 03/24/18 Follow-up type: In-patient    Latonda Larrivee, Elta [place] 03/23/2018, 7:15 AM

## 2018-03-23 NOTE — Op Note (Signed)
NAMETUJUANA, KILMARTIN MEDICAL RECORD [id number] ACCOUNT [id number] DATE OF BIRTH:[birth year] FACILITY: [facility] LOCATION: [id number] PHYSICIAN:Eshaal Duby A. Malessa Zartman, MD  OPERATIVE REPORT  DATE OF PROCEDURE:  03/22/2018  PREOPERATIVE DIAGNOSIS:  Previous cesarean section, term gestation, intrauterine growth restriction.  PROCEDURE:  Repeat cesarean section, Kerr hysterotomy.  POSTOPERATIVE DIAGNOSIS:  Previous cesarean section, term gestation, intrauterine growth restriction, subserosal fibroids.  ANESTHESIA:  Spinal.  SURGEON:  Servando Salina, MD  ASSISTANT:  None.  DESCRIPTION OF PROCEDURE:  Under adequate spinal anesthesia, the patient was placed in the supine position with a left lateral tilt.  She was sterilely prepped and draped in the usual fashion.  An indwelling Foley catheter was sterilely placed.  0.5% Marcaine was injected along the previous Pfannenstiel skin incision site.  Pfannenstiel skin incision made, carried down to the rectus fascia.  Rectus fascia was opened transversely.  The rectus fascia was then bluntly and sharply dissected off the rectus muscle in a superior and inferior fashion.  The rectus muscle was split in the midline.  The parietal peritoneum was entered bluntly and extended superiorly and inferiorly.  An Alexis self-retaining retractor was then placed.  A thin lower uterine segment was noted with adherent lower uterine segment bladder peritoneum.  A careful vesicouterine peritoneum opening transversely was accomplished.  The bladder was gently displaced inferiorly.  A curvilinear low transverse incision was then made and extended with bandage scissors.  Artificial rupture of membranes occurred.  Clear amniotic fluid was noted.  Subsequent delivery of a live female with nuchal cord x2 which were reduced was accomplished.  The baby had delayed cord clamping.  Baby  was bulb suctioned on the abdomen.  The cord was subsequently clamped and cut.  The baby was  transferred to the awaiting pediatricians who assigned Apgars of 9 and 9 at one and five minutes.  The placenta was manually removed.  Uterine cavity was cleaned of debris.  Uterine incision had no extension.  It was closed in 2 layers.  The first layer with 0 Monocryl running lock stitch and second layer with imbricating 0 Monocryl suture.  The abdomen was irrigated and suctioned of debris.  The Alexis  retractor was then removed.  Omental adhesion to the anterior abdominal wall was noted and was lysed.  Normal tubes and ovaries were then seen on both sides. Interceed was placed in an inverted T fashion over the incision.  The parietal peritoneum was then closed with 2-0 Vicryl.  The rectus fascia was closed with 0 Vicryl x2.  The subcutaneous area was irrigated, small bleeders cauterized.  Interrupted 2-0 plain sutures placed and the skin approximated with 4-0 Vicryl subcuticular closure.   Steri-Strips and benzoin was placed.  SPECIMENS:  Placenta not sent to pathology.  ESTIMATED BLOOD LOSS:  830 mL.  INTRAOPERATIVE FLUIDS:  2200 mL.  URINE OUTPUT:  100 mL.  COUNTS:  Sponge and instrument count x2 was correct.  COMPLICATIONS:  None.  The patient tolerated the procedure well and was transferred to recovery room in stable condition.  The baby was transferred to the regular nursery early due to low blood sugar.  LN/NUANCE  D:03/23/2018 T:03/23/2018 JOB:[id number]

## 2018-03-24 NOTE — Progress Notes (Addendum)
Subjective: POD# 2 Information for the patient's newborn:  Leocadia, Idleman [[id number]]  female   Girl "Zuri"  Reports feeling really well and ready to go home Feeding: breast and bottle Patient reports tolerating PO and denies N/V.  Breast symptoms:denies Pain controlled with PO meds Denies HA/SOB/dizziness.  Flatus passing Vaginal bleeding is normal, w/o clots. Ambulating and urinating w/o difficulty     Objective:  VS:    Vitals:   03/23/18 1014 03/23/18 1400 03/23/18 2201 03/24/18 0502  BP: 115/66 121/76 115/75 112/73  Pulse: 71 79 73 66  Resp: 19 20 20 16   Temp: 97.9 F (36.6 C) 97.7 F (36.5 C) 98.3 F (36.8 C) 97.6 F (36.4 C)  TempSrc: Oral Oral Oral Oral  SpO2:   100% 100%  Weight:      Height:          Intake/Output Summary (Last 24 hours) at 03/24/2018 1217 Last data filed at 03/23/2018 1500 Gross per 24 hour  Intake 480 ml  Output 750 ml  Net -270 ml       Recent Labs    03/22/18 1045 03/23/18 0535  WBC 5.6 8.1  HGB 10.1* 8.8*  HCT 31.1* 26.2*  PLT 259 201    Blood type: --/--/B POS, B POS Performed at [facility], [address] [place], [place] [address]  (08/09 1045) Rubella:      Physical Exam:  General: alert, cooperative and no distress Abdomen: soft, nontender Incision: clean, dry, intact and Honeycomb dressing Perineum:  Uterine Fundus: firm, below umbilicus, nontender Lochia: minimal Ext: NO edema, calf pain, tenderness, or cords   Assessment/Plan: 37 y.o.   POD# 2. [id number]                  Repeat Cesarean Section -stable -routine PP orders  Anemia-acute blood loss -Feraheme 510 mg IV forst dose administered 8/10 -Second dose [facility]    Routine post-op PP care          Advised warm fluids and ambulation to improve GI motility Encourage rest when baby rests Breastfeeding support PRN  May D/c today if infant is D/C'd  Arrie Eastern, SNM 03/24/2018, 12:17 PM

## 2018-03-24 NOTE — Lactation Note (Signed)
This note was copied from a baby's chart. Lactation Consultation Note  Patient Name: Savannah Hart [id number] Date: 03/24/2018 Reason for consult: Follow-up assessment;Early term 37-38.6wks;Infant < 6lbs;Infant weight loss(2% weight loss / breast / and formula / see LC note )  Baby is 77 hours old As mom entered the room, mom resting in bed, and several family members visiting, baby being held by the older  Sibling with  dad at her side.  Per hasn't pumped in the last 24 hours.  LC recommended since the baby is an early term infant , less than 6 pounds, if the baby is wide awake, try to latch, for 15 -20 mins and then supplement, post pump both breast for 15 - 20 mins/ save the milk for the next feeding.  If there is a feeding and its been 3 hours since the last feeding and the baby isn't in to latching, try and appetizer about 10 ml  And then latch, if still no latch, feed entire supplement, if wide awake feed latch , and then post pump.  LC discussed supply and demand and the importance of breast stimulation.  Mom felt she might be able to do that plan.  LC reviewed doc flow sheets and WNL for her age.    Maternal Data    Feeding Feeding Type: (LC enc mom to feed at least every 3 hours )  LATCH Score                   Interventions Interventions: Breast feeding basics reviewed  Lactation Tools Discussed/Used     Consult Status Consult Status: Follow-up Date: 03/25/18 Follow-up type: In-patient    [place] 03/24/2018, 3:13 PM

## 2018-03-25 ENCOUNTER — Other Ambulatory Visit: Payer: Self-pay | Admitting: Obstetrics and Gynecology

## 2018-03-25 ENCOUNTER — Other Ambulatory Visit: Payer: Self-pay

## 2018-03-25 MED ORDER — FERROUS SULFATE 325 (65 FE) MG PO TABS: 325.0000 mg | ORAL_TABLET | Freq: Two times a day (BID) | ORAL | 3 refills | Status: DC

## 2018-03-25 MED ORDER — OXYCODONE-ACETAMINOPHEN 5-325 MG PO TABS: 1.0000 | ORAL_TABLET | Freq: Four times a day (QID) | ORAL | 0 refills | Status: DC | PRN

## 2018-03-25 MED ORDER — IBUPROFEN 600 MG PO TABS: 600.0000 mg | ORAL_TABLET | Freq: Four times a day (QID) | ORAL | 0 refills | Status: DC

## 2018-03-25 NOTE — Progress Notes (Signed)
Patient w/o c/o tol po, voiding w/o difficulty, ambulating, +flatus Pain controlled Denies lightheadedness, dizziness  Patient Vitals for the past 24 hrs:  BP Temp Temp src Pulse Resp SpO2  03/25/18 0635 117/86 98.6 F (37 C) Oral 81 20 96 %  03/24/18 2144 114/69 98.4 F (36.9 C) Oral 85 18 -  03/24/18 1400 119/75 97.7 F (36.5 C) Oral 65 20 100 %   A&ox3 rrr ctab Abd: soft, nt, nd; fundus firm and 2cm below umb; dressing c/d/i LE: tr edema, nt bilat  CBC Latest Ref Rng & Units 03/23/2018 03/22/2018 03/16/2010  WBC 4.0 - 10.5 K/uL 8.1 5.6 15.7(H)  Hemoglobin 12.0 - 15.0 g/dL 8.8(L) 10.1(L) 9.9 DELTA CHECK NOTED REPEATED TO VERIFY(L)  Hematocrit 36.0 - 46.0 % 26.2(L) 31.1(L) 29.3(L)  Platelets 150 - 400 K/uL 201 259 177 DELTA CHECK NOTED REPEATED TO VERIFY   A/P: pod 3 s/p rltcs 1. Doing well, d/c home today; plan outpt f/u in 6 wks; office to call and f/u with pt in 1-2 wks 2. Chronic anemia with acute change - asymptomatic, s/p iv iron x1 dose; plan iron bid and f/u iv iron 8/17 3. Rh pos 4. Rubella immune

## 2018-03-25 NOTE — Lactation Note (Signed)
This note was copied from a baby's chart. Lactation Consultation Note  Patient Name: Savannah Hart HWYSH'U Date: 03/25/2018 Reason for consult: Follow-up assessment   P2, Baby 7 hours old.  < 5 lbs. Mother has been primarily formula feeding due to infant's weight and energy level. Mother has not been pumping.  Encouraged her to call her insurance company for [employer]. Mother states she has manual pump and plans to start pumping when she gets home. Discussed pumping q 3 hours protect her milk supply. Reviewed engorgement care.    Maternal Data    Feeding Feeding Type: Bottle Fed - Formula Nipple Type: Slow - flow  LATCH Score                   Interventions Interventions: DEBP  Lactation Tools Discussed/Used     Consult Status Consult Status: Complete    Savannah Hart 03/25/2018, 10:01 AM

## 2018-03-25 NOTE — Progress Notes (Signed)
D/c instructions reviewed & given, to include when to call the doctor & prescriptions information.  Pt given [facility] flyer & [facility] d/c booklet.  Pt to be d/c home in stable condition with husband.  Pt instructed to notify staff when ready to be escorted out.

## 2018-03-25 NOTE — Discharge Summary (Signed)
Obstetric Discharge Summary Reason for Admission: cesarean section Prenatal Procedures: none Intrapartum Procedures: cesarean: low cervical, transverse Postpartum Procedures: iv iron Complications-Operative and Postpartum: none Hemoglobin  Date Value Ref Range Status  03/23/2018 8.8 (L) 12.0 - 15.0 g/dL Final   HCT  Date Value Ref Range Status  03/23/2018 26.2 (L) 36.0 - 46.0 % Final    Physical Exam:  General: alert and cooperative Lochia: appropriate Uterine Fundus: firm Incision: healing well DVT Evaluation: No evidence of DVT seen on physical exam.  Discharge Diagnoses: Term Pregnancy-delivered  Discharge Information: Date: 03/25/2018 Activity: pelvic rest and light activity Diet: routine Medications: Ibuprofen, Iron and Percocet Condition: stable Instructions: refer to practice specific booklet Discharge to: home   Newborn Data: Live born female  Birth Weight: 5 lb 2 oz (2325 g) APGAR: 54, 9  Newborn Delivery   Birth date/time:  03/22/2018 12:57:00 Delivery type:  C-Section, Low Transverse Trial of labor:  No C-section categorization:  Repeat     Home with mother.  Savannah Hart 03/25/2018, 8:01 AM

## 2018-03-28 ENCOUNTER — Other Ambulatory Visit: Payer: Self-pay | Admitting: Obstetrics and Gynecology

## 2018-04-01 ENCOUNTER — Ambulatory Visit (HOSPITAL_COMMUNITY)
Admission: RE | Admit: 2018-04-01 | Discharge: 2018-04-01 | Disposition: A | Discharge status: Home / Self Care | Payer: BC Managed Care – PPO | Source: Ambulatory Visit | Attending: Obstetrics and Gynecology | Admitting: Obstetrics and Gynecology

## 2018-04-01 DIAGNOSIS — D509 Iron deficiency anemia, unspecified: Secondary | ICD-10-CM | POA: Insufficient documentation

## 2018-04-01 MED ORDER — SODIUM CHLORIDE 0.9 % IV SOLN
Freq: Once | INTRAVENOUS | Status: AC
  Administered 2018-04-01: 10 mL/h via INTRAVENOUS

## 2018-04-01 MED ORDER — SODIUM CHLORIDE 0.9 % IV SOLN
510.0000 mg | Freq: Once | INTRAVENOUS | Status: AC
  Administered 2018-04-01: 510 mg via INTRAVENOUS
  Filled 2018-04-01: qty 17

## 2018-04-01 NOTE — Discharge Instructions (Signed)

## 2018-04-01 NOTE — Progress Notes (Signed)
Patient received Feraheme via PIV. Observed for at least 30 minutes post infusion. Tolerated well, vitals stable, discharge instructions given.. Patient alert, oriented and ambulatory at the time of discharge. However, patient was advised to follow up up with her doctor in view of the elevated blood pressure. Patient verbalized understanding.

## 2018-04-01 NOTE — Progress Notes (Signed)
Pre infusion Blood pressure readings of 142/74  and 140/72 were reported to [place] MD 's office. Per MD's order, it is okay to go ahead and infuse scheduled Feraheme. Order noted.

## 2019-07-24 IMAGING — US US MFM OB DETAIL+14 WK
1 series · 14 of 28 positions shown · non-contrast
Comparison: none

[Series 1: us mfm ob detail+14 wk · 169 acquisitions, 14 frames shown]
[im 7/169]
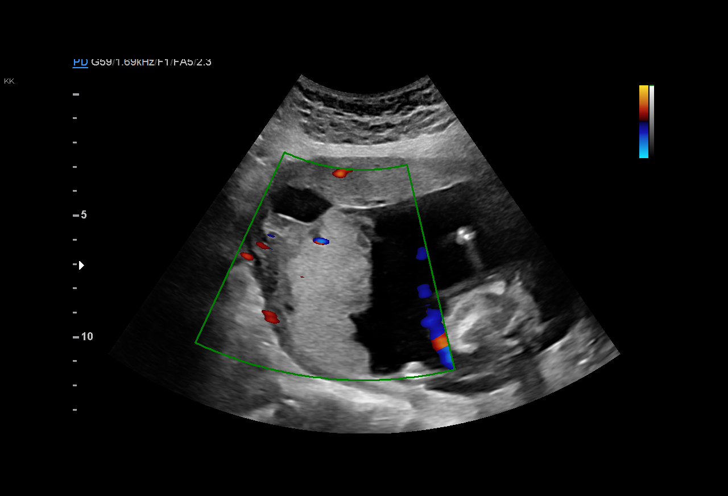
[im 19/169]
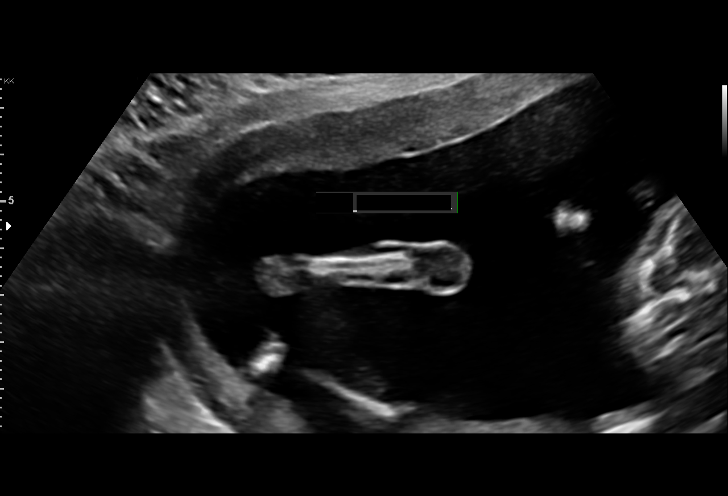
[im 32/169]
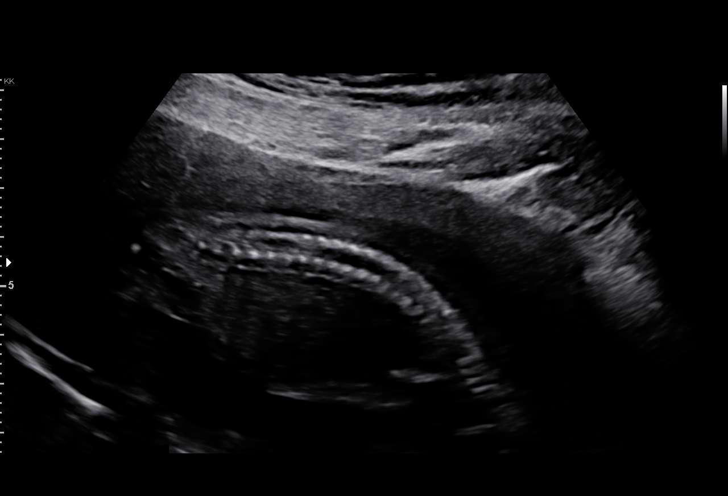
[im 44/169]
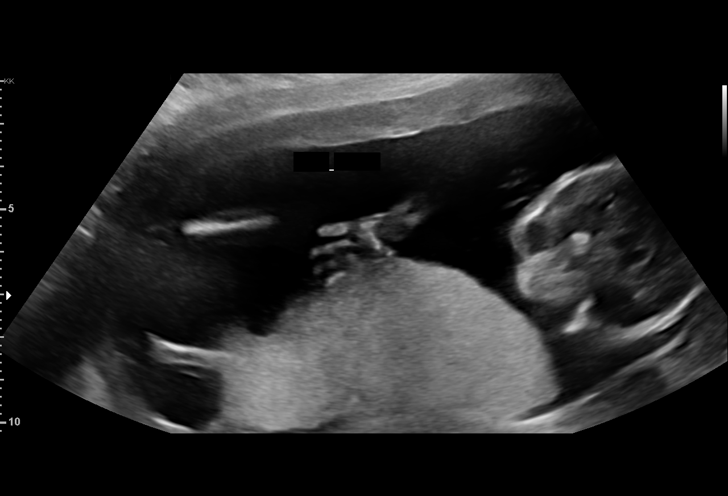
[im 57/169]
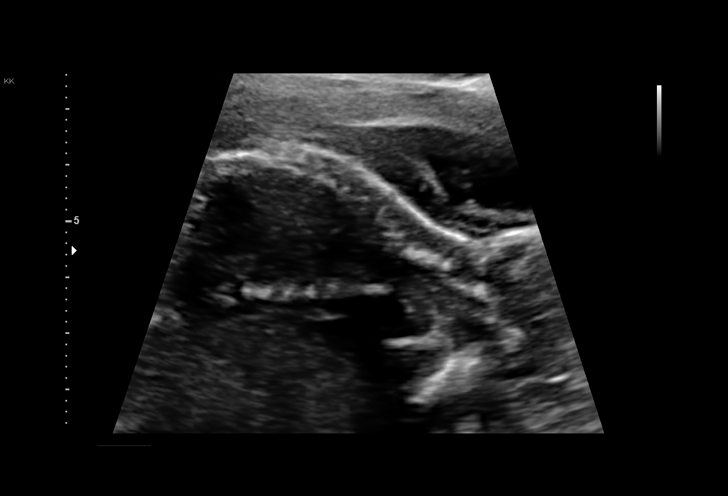
[im 69/169]
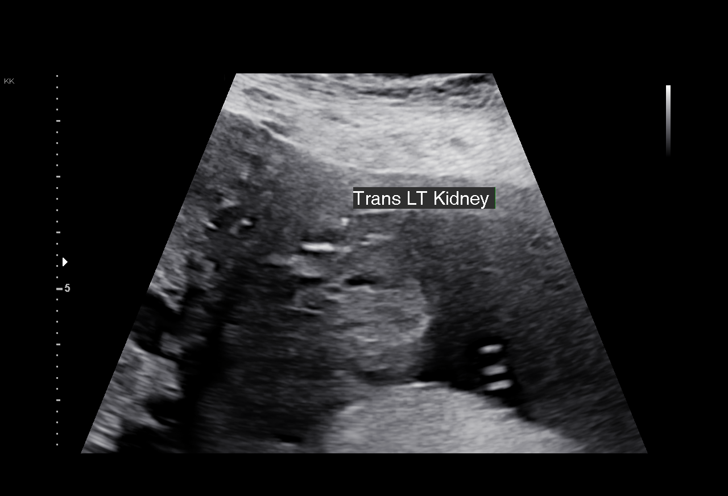
[im 81/169]
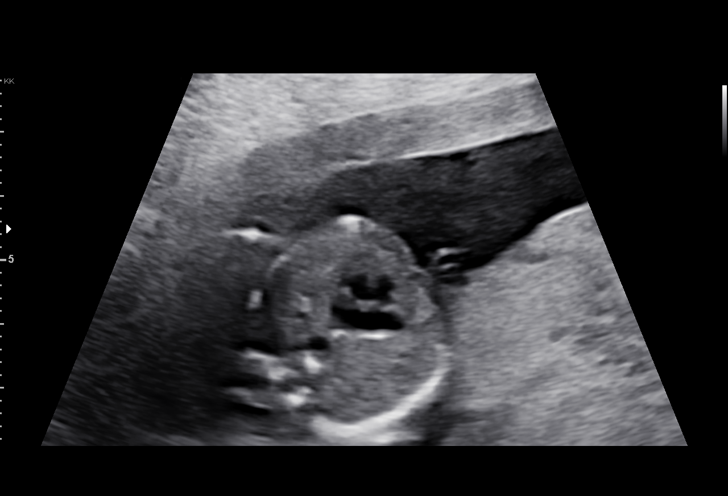
[im 94/169]
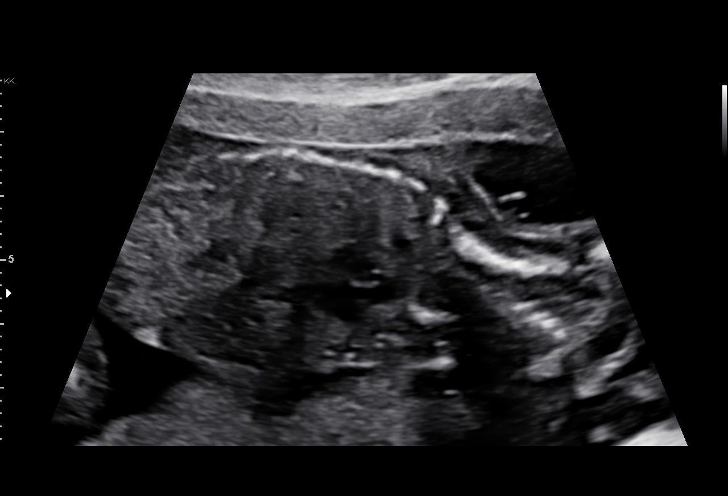
[im 106/169]
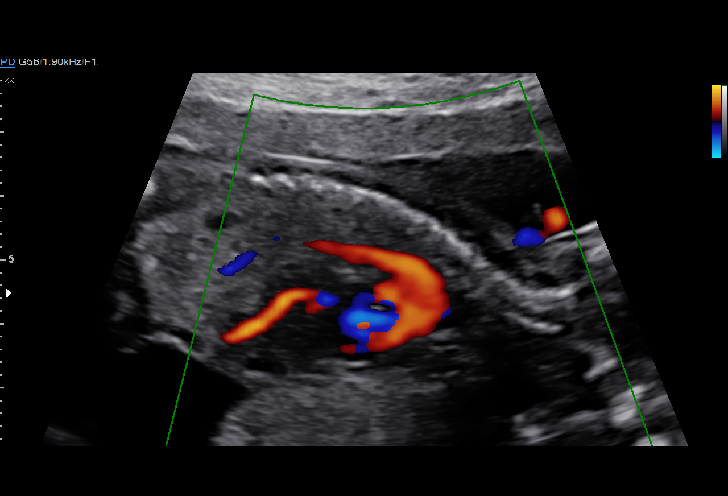
[im 119/169]
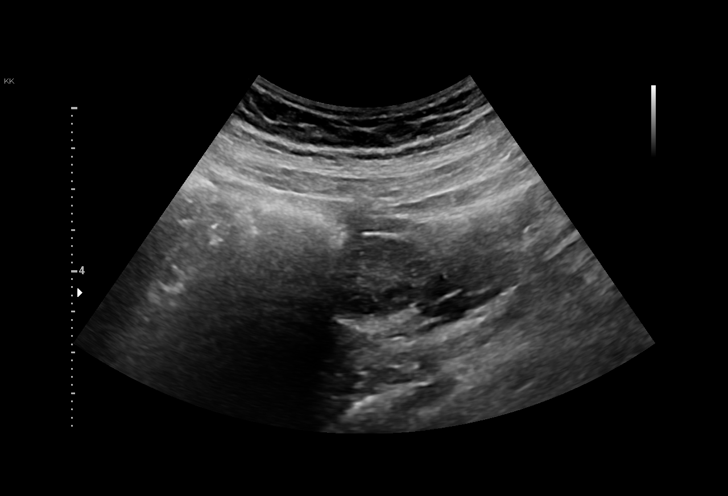
[im 131/169]
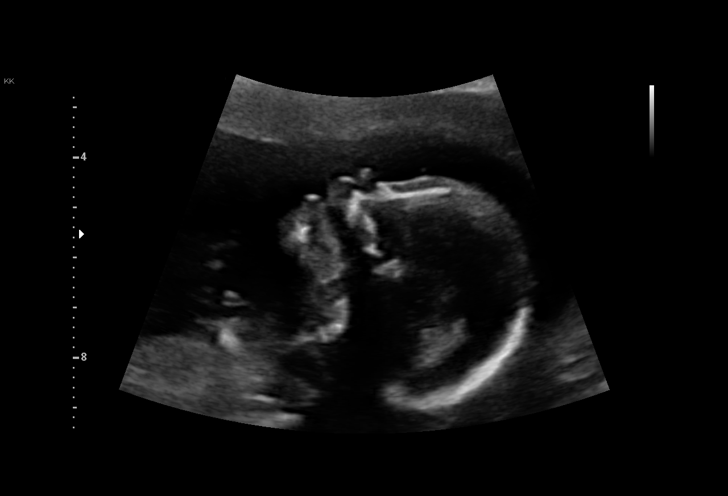
[im 144/169]
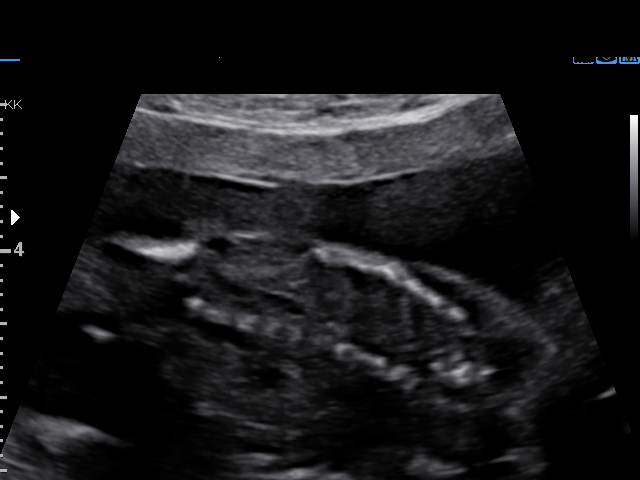
[im 156/169]
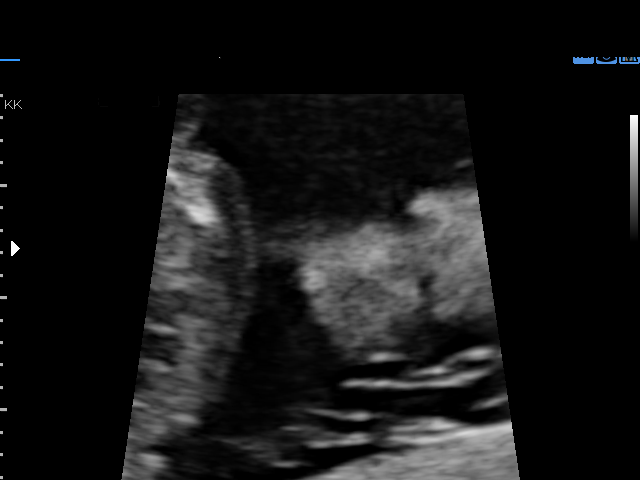
[im 169/169]
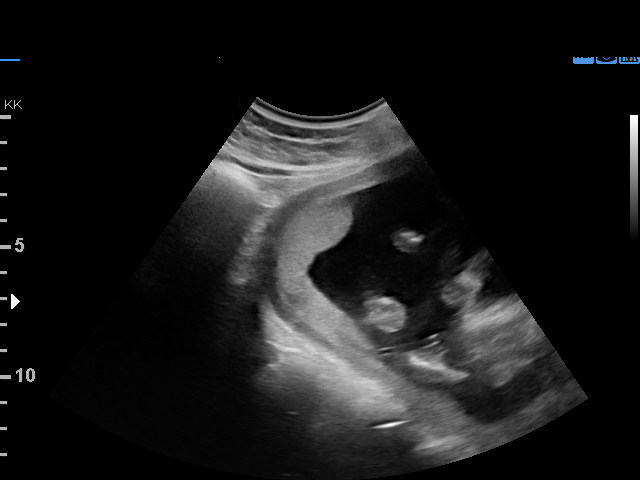

[14 of 28 positions shown; findings below may reference images not displayed]

[facility]

1  ZAK               [id number]       [id number]     [id number]
HEMRYK
Indications

18 weeks gestation of pregnancy
Encounter for antenatal screening for
malformations
Advanced maternal age multigravida 35+,
second trimester
Abnormal biochemical screen (Low fetal
fraction on NIPS)

OB History

Blood Type:            Height:  5'2"   Weight (lb):  183       BMI:
Gravidity:    3         Term:   1
Living:       1
Fetal Evaluation

Num Of Fetuses:     1
Cardiac Activity:   Observed
Presentation:       Cephalic
Placenta:           Posterior Fundal, above cervical os
P. Cord Insertion:  Visualized

Amniotic Fluid
AFI FV:      Subjectively within normal limits

Largest Pocket(cm)
3.8
Biometry

BPD:      37.8  mm     G. Age:  17w 4d         24  %    CI:        71.71   %    70 - 86
FL/HC:      17.7   %    15.8 - 18
HC:      142.1  mm     G. Age:  17w 4d         15  %    HC/AC:      1.16        1.07 -
AC:      122.9  mm     G. Age:  17w 6d         41  %    FL/BPD:     66.7   %
FL:       25.2  mm     G. Age:  17w 5d         26  %    FL/AC:      20.5   %    20 - 24
HUM:      25.7  mm     G. Age:  18w 0d         53  %

Est. FW:     208  gm      0 lb 7 oz     40  %
Gestational Age

LMP:           18w 1d        Date:  06/25/17                 EDD:   04/01/18
U/S Today:     17w 5d                                        EDD:   04/04/18
Best:          18w 1d     Det. By:  LMP  (06/25/17)          EDD:   04/01/18
Anatomy

Cranium:               Appears normal         Aortic Arch:            Appears normal
Cavum:                 Appears normal         Ductal Arch:            Not well visualized
Ventricles:            Appears normal         Diaphragm:              Appears normal
Choroid Plexus:        Appears normal         Stomach:                Appears normal, left
sided
Cerebellum:            Appears normal         Abdomen:                Appears normal
Posterior Fossa:       Appears normal         Abdominal Wall:         Appears nml (cord
insert, abd wall)
Nuchal Fold:           Appears normal         Cord Vessels:           Appears normal (3
vessel cord)
Face:                  Appears normal         Kidneys:                Appear normal
(orbits and profile)
Lips:                  Not well visualized    Bladder:                Appears normal
Thoracic:              Appears normal         Spine:                  Appears normal
Heart:                 Echogenic focus        Upper Extremities:      Appears normal
in LV
RVOT:                  Not well visualized    Lower Extremities:      Appears normal
LVOT:                  Not well visualized

Other:  Parents do not wish to know sex of fetus. Heels and 5th digit
visualized. Technically difficult due to fetal position.
Cervix Uterus Adnexa

Cervix
Length:            4.3  cm.
Normal appearance by transabdominal scan.

Left Ovary
Within normal limits.

Right Ovary
Within normal limits.

Adnexa:       No abnormality visualized.
Impression

Patient on the low risk schedule so I did not see this until
started reading ultrasounds and the patient had already left
Single living intrauterine pregnancy at 18w 1d.
Placenta Posterior Fundal, above cervical os.
Appropriate fetal growth.
Normal amniotic fluid volume.
The fetal anatomic survey is not complete.
Echogenic foci noted in the fetal heart
The cervix measures 4.3cm transabdominally without
funneling.
The adnexa appear normal bilaterally without masses.
Recommendations

Would recommend genetic counseling given AMA and soft
marker for aneuploidy noted on ultrasound today

## 2019-10-23 ENCOUNTER — Ambulatory Visit: Payer: BC Managed Care – PPO | Attending: Internal Medicine

## 2019-10-23 DIAGNOSIS — Z23 Encounter for immunization: Secondary | ICD-10-CM

## 2019-10-23 NOTE — Progress Notes (Signed)
   Covid-19 Vaccination Clinic  Name:  Savannah Hart    MRN: [id number] DOB: 09-13-1980  10/23/2019  Savannah Hart was observed post Covid-19 immunization for 30 minutes based on pre-vaccination screening without incident. She was provided with Vaccine Information Sheet and instruction to access the V-Safe system.   Savannah Hart was instructed to call 911 with any severe reactions post vaccine: Marland Kitchen Difficulty breathing  . Swelling of face and throat  . A fast heartbeat  . A bad rash all over body  . Dizziness and weakness   Immunizations Administered    Name Date Dose VIS Date Route   Pfizer COVID-19 Vaccine 10/23/2019 11:10 AM 0.3 mL 07/25/2019 Intramuscular   Manufacturer: [place]   Lot: [id number]   [place]: [id number]

## 2019-11-18 ENCOUNTER — Ambulatory Visit: Payer: BC Managed Care – PPO | Attending: Internal Medicine

## 2019-11-18 DIAGNOSIS — Z23 Encounter for immunization: Secondary | ICD-10-CM

## 2019-11-18 NOTE — Progress Notes (Signed)
   Covid-19 Vaccination Clinic  Name:  Savannah Hart    MRN: [id number] DOB: [birth year]  11/18/2019  [name] was observed post Covid-19 immunization for 15 minutes without incident. She was provided with Vaccine Information Sheet and instruction to access the V-Safe system.   [name] was instructed to call 911 with any severe reactions post vaccine: Marland Kitchen Difficulty breathing  . Swelling of face and throat  . A fast heartbeat  . A bad rash all over body  . Dizziness and weakness   Immunizations Administered    Name Date Dose VIS Date Route   Pfizer COVID-19 Vaccine 11/18/2019  8:28 AM 0.3 mL 07/25/2019 Intramuscular   Manufacturer: [employer]   Lot: [id number]   [place]: [id number]

## 2023-06-18 ENCOUNTER — Other Ambulatory Visit: Payer: Self-pay | Admitting: Obstetrics and Gynecology

## 2023-07-23 ENCOUNTER — Encounter (HOSPITAL_COMMUNITY): Payer: Self-pay | Admitting: *Deleted

## 2023-07-23 NOTE — Progress Notes (Signed)
Spoke w/ via phone for pre-op interview--- Shanda Bumps Lab needs dos----  CBC per surgeon       Lab results------ COVID test -----patient states asymptomatic no test needed Arrive at -------0800 NPO after MN NO Solid Food.  Clear liquids from MN until---0700 Med rec completed Medications to take morning of surgery -----NONE Diabetic medication ----- Patient instructed no nail polish to be worn day of surgery Patient instructed to bring photo id and insurance card day of surgery Patient aware to have Driver (ride ) / caregiver    for 24 hours after surgery - Husband Elzie Rings Patient Special Instructions ----- Pre-Op special Instructions ----- Patient verbalized understanding of instructions that were given at this phone interview. Patient denies chest pain, sob, fever, cough at the interview.

## 2023-07-27 ENCOUNTER — Encounter (HOSPITAL_BASED_OUTPATIENT_CLINIC_OR_DEPARTMENT_OTHER): Payer: Self-pay | Admitting: Obstetrics and Gynecology

## 2023-07-27 ENCOUNTER — Ambulatory Visit (HOSPITAL_BASED_OUTPATIENT_CLINIC_OR_DEPARTMENT_OTHER): Payer: BC Managed Care – PPO | Admitting: Anesthesiology

## 2023-07-27 ENCOUNTER — Other Ambulatory Visit: Payer: Self-pay

## 2023-07-27 ENCOUNTER — Ambulatory Visit (HOSPITAL_BASED_OUTPATIENT_CLINIC_OR_DEPARTMENT_OTHER)
Admission: RE | Admit: 2023-07-27 | Discharge: 2023-07-27 | Disposition: A | Discharge status: Home / Self Care | Payer: BC Managed Care – PPO | Source: Ambulatory Visit | Attending: Obstetrics and Gynecology | Admitting: Obstetrics and Gynecology

## 2023-07-27 ENCOUNTER — Encounter (HOSPITAL_BASED_OUTPATIENT_CLINIC_OR_DEPARTMENT_OTHER)
Admission: RE | Disposition: A | Discharge status: Home / Self Care | Payer: Self-pay | Source: Ambulatory Visit | Attending: Obstetrics and Gynecology

## 2023-07-27 DIAGNOSIS — N909 Noninflammatory disorder of vulva and perineum, unspecified: Secondary | ICD-10-CM | POA: Diagnosis present

## 2023-07-27 DIAGNOSIS — Z6832 Body mass index (BMI) 32.0-32.9, adult: Secondary | ICD-10-CM | POA: Insufficient documentation

## 2023-07-27 DIAGNOSIS — N920 Excessive and frequent menstruation with regular cycle: Secondary | ICD-10-CM | POA: Diagnosis present

## 2023-07-27 DIAGNOSIS — D280 Benign neoplasm of vulva: Secondary | ICD-10-CM | POA: Insufficient documentation

## 2023-07-27 DIAGNOSIS — E669 Obesity, unspecified: Secondary | ICD-10-CM | POA: Diagnosis not present

## 2023-07-27 HISTORY — PX: VULVAR LESION REMOVAL: SHX5391

## 2023-07-27 LAB — POCT PREGNANCY, URINE: Preg Test, Ur: NEGATIVE

## 2023-07-27 LAB — CBC
HCT: 28.4 % — ABNORMAL LOW (ref 36.0–46.0)
Hemoglobin: 8.5 g/dL — ABNORMAL LOW (ref 12.0–15.0)
MCH: 20.6 pg — ABNORMAL LOW (ref 26.0–34.0)
MCHC: 29.9 g/dL — ABNORMAL LOW (ref 30.0–36.0)
MCV: 68.9 fL — ABNORMAL LOW (ref 80.0–100.0)
Platelets: 355 10*3/uL (ref 150–400)
RBC: 4.12 MIL/uL (ref 3.87–5.11)
RDW: 19.5 % — ABNORMAL HIGH (ref 11.5–15.5)
WBC: 4.3 10*3/uL (ref 4.0–10.5)
nRBC: 0 % (ref 0.0–0.2)

## 2023-07-27 SURGERY — VULVAR LESION
Anesthesia: General

## 2023-07-27 MED ORDER — OXYCODONE HCL 5 MG PO TABS: 5.0000 mg | ORAL_TABLET | Freq: Once | ORAL | Status: DC | PRN

## 2023-07-27 MED ORDER — IBUPROFEN 800 MG PO TABS: 800.0000 mg | ORAL_TABLET | Freq: Three times a day (TID) | ORAL | 11 refills | Status: AC | PRN

## 2023-07-27 MED ORDER — SODIUM CHLORIDE 0.9 % IV SOLN: 12.5000 mg | INTRAVENOUS | Status: DC | PRN

## 2023-07-27 MED ORDER — DEXMEDETOMIDINE HCL IN NACL 80 MCG/20ML IV SOLN
INTRAVENOUS | Status: DC | PRN
  Administered 2023-07-27: 8 ug via INTRAVENOUS

## 2023-07-27 MED ORDER — FENTANYL CITRATE (PF) 100 MCG/2ML IJ SOLN
INTRAMUSCULAR | Status: AC
  Filled 2023-07-27: qty 2

## 2023-07-27 MED ORDER — OXYCODONE HCL 5 MG/5ML PO SOLN: 5.0000 mg | Freq: Once | ORAL | Status: DC | PRN

## 2023-07-27 MED ORDER — MIDAZOLAM HCL 2 MG/2ML IJ SOLN
INTRAMUSCULAR | Status: AC
  Filled 2023-07-27: qty 2

## 2023-07-27 MED ORDER — PROPOFOL 10 MG/ML IV BOLUS
INTRAVENOUS | Status: DC | PRN
  Administered 2023-07-27: 200 mg via INTRAVENOUS

## 2023-07-27 MED ORDER — ACETAMINOPHEN 500 MG PO TABS
1000.0000 mg | ORAL_TABLET | Freq: Once | ORAL | Status: AC
  Administered 2023-07-27: 1000 mg via ORAL

## 2023-07-27 MED ORDER — SODIUM CHLORIDE 0.9 % IR SOLN
Status: DC | PRN
  Administered 2023-07-27: 1000 mL

## 2023-07-27 MED ORDER — MIDAZOLAM HCL 2 MG/2ML IJ SOLN
INTRAMUSCULAR | Status: DC | PRN
  Administered 2023-07-27: 2 mg via INTRAVENOUS

## 2023-07-27 MED ORDER — DEXAMETHASONE SODIUM PHOSPHATE 10 MG/ML IJ SOLN
INTRAMUSCULAR | Status: DC | PRN
  Administered 2023-07-27: 10 mg via INTRAVENOUS

## 2023-07-27 MED ORDER — PROPOFOL 10 MG/ML IV BOLUS
INTRAVENOUS | Status: AC
  Filled 2023-07-27: qty 20

## 2023-07-27 MED ORDER — AMISULPRIDE (ANTIEMETIC) 5 MG/2ML IV SOLN: 10.0000 mg | Freq: Once | INTRAVENOUS | Status: DC | PRN

## 2023-07-27 MED ORDER — FENTANYL CITRATE (PF) 100 MCG/2ML IJ SOLN
25.0000 ug | INTRAMUSCULAR | Status: DC | PRN
  Administered 2023-07-27: 25 ug via INTRAVENOUS

## 2023-07-27 MED ORDER — POVIDONE-IODINE 10 % EX SWAB: 2.0000 | Freq: Once | CUTANEOUS | Status: DC

## 2023-07-27 MED ORDER — FENTANYL CITRATE (PF) 250 MCG/5ML IJ SOLN
INTRAMUSCULAR | Status: DC | PRN
  Administered 2023-07-27: 50 ug via INTRAVENOUS
  Administered 2023-07-27 (×2): 25 ug via INTRAVENOUS

## 2023-07-27 MED ORDER — ACETAMINOPHEN 500 MG PO TABS
ORAL_TABLET | ORAL | Status: AC
Start: 2023-07-27 — End: ?
  Filled 2023-07-27: qty 2

## 2023-07-27 MED ORDER — SODIUM CHLORIDE 0.9 % IV SOLN: INTRAVENOUS | Status: DC

## 2023-07-27 MED ORDER — ONDANSETRON HCL 4 MG/2ML IJ SOLN
INTRAMUSCULAR | Status: DC | PRN
  Administered 2023-07-27: 4 mg via INTRAVENOUS

## 2023-07-27 MED ORDER — BUPIVACAINE-EPINEPHRINE 0.25% -1:200000 IJ SOLN
INTRAMUSCULAR | Status: DC | PRN
  Administered 2023-07-27: 14 mL

## 2023-07-27 MED ORDER — LIDOCAINE 2% (20 MG/ML) 5 ML SYRINGE
INTRAMUSCULAR | Status: DC | PRN
  Administered 2023-07-27: 60 mg via INTRAVENOUS

## 2023-07-27 SURGICAL SUPPLY — 37 items
BENZOIN TINCTURE PRP APPL 2/3 (GAUZE/BANDAGES/DRESSINGS) IMPLANT
BLADE SURG 15 STRL LF DISP TIS (BLADE) ×1 IMPLANT
CLOTH BEACON ORANGE TIMEOUT ST (SAFETY) ×1 IMPLANT
COVER BACK TABLE 60X90IN (DRAPES) ×1 IMPLANT
DRAPE HYSTEROSCOPY (MISCELLANEOUS) ×1 IMPLANT
DRAPE SHEET LG 3/4 BI-LAMINATE (DRAPES) ×1 IMPLANT
ELECT NDL TIP 2.8 STRL (NEEDLE) IMPLANT
ELECT NEEDLE TIP 2.8 STRL (NEEDLE) IMPLANT
ELECT REM PT RETURN 9FT ADLT (ELECTROSURGICAL) ×1 IMPLANT
ELECTRODE REM PT RTRN 9FT ADLT (ELECTROSURGICAL) ×1 IMPLANT
GAUZE 4X4 16PLY ~~LOC~~+RFID DBL (SPONGE) ×1 IMPLANT
GLOVE BIO SURGEON STRL SZ 6.5 (GLOVE) ×2 IMPLANT
GOWN STRL REUS W/TWL LRG LVL3 (GOWN DISPOSABLE) ×1 IMPLANT
KIT TURNOVER CYSTO (KITS) ×1 IMPLANT
LEGGING LITHOTOMY PAIR STRL (DRAPES) ×1 IMPLANT
MANIFOLD NEPTUNE II (INSTRUMENTS) IMPLANT
NDL HYPO 25X1 1.5 SAFETY (NEEDLE) IMPLANT
NEEDLE HYPO 25X1 1.5 SAFETY (NEEDLE) IMPLANT
NS IRRIG 500ML POUR BTL (IV SOLUTION) IMPLANT
PACK BASIN DAY SURGERY FS (CUSTOM PROCEDURE TRAY) ×1 IMPLANT
PAD OB MATERNITY 4.3X12.25 (PERSONAL CARE ITEMS) ×1 IMPLANT
PAD PREP 24X48 CUFFED NSTRL (MISCELLANEOUS) ×1 IMPLANT
PENCIL SMOKE EVACUATOR (MISCELLANEOUS) ×1 IMPLANT
SLEEVE SCD COMPRESS KNEE MED (STOCKING) ×1 IMPLANT
STRIP CLOSURE SKIN 1/4X4 (GAUZE/BANDAGES/DRESSINGS) IMPLANT
SUT MNCRL AB 3-0 PS2 18 (SUTURE) IMPLANT
SUT PLAIN 3 0 FS 2 27 (SUTURE) IMPLANT
SUT PLAIN ABS 2-0 CT1 27XMFL (SUTURE) IMPLANT
SUT VIC AB 4-0 PS2 27 (SUTURE) IMPLANT
SWAB COLLECTION DEVICE MRSA (MISCELLANEOUS) IMPLANT
SWAB CULTURE ESWAB REG 1ML (MISCELLANEOUS) IMPLANT
SYR BULB IRRIG 60ML STRL (SYRINGE) IMPLANT
TOWEL OR 17X24 6PK STRL BLUE (TOWEL DISPOSABLE) ×2 IMPLANT
TRAY DSU PREP LF (CUSTOM PROCEDURE TRAY) ×1 IMPLANT
TUBE CONNECTING 12X1/4 (SUCTIONS) IMPLANT
WATER STERILE IRR 500ML POUR (IV SOLUTION) ×1 IMPLANT
YANKAUER SUCT BULB TIP NO VENT (SUCTIONS) IMPLANT

## 2023-07-27 NOTE — Op Note (Unsigned)
NAMELUDDIE, GERHOLD MEDICAL RECORD NO: [id number] ACCOUNT NO: [id number] DATE OF BIRTH: Nov 13, 1980 FACILITY: [facility] LOCATION: [facility] PHYSICIAN: Denielle Bayard A. Cherly Hensen, MD  Operative Report   DATE OF PROCEDURE: 07/27/2023  PREOPERATIVE DIAGNOSIS:  Vulvar mass.  PROCEDURE:  Excision of vulvar mass.  POSTOPERATIVE DIAGNOSIS:  Vulvar mass.  ANESTHESIA:  General and local.  SURGEON:  Maxie Better, MD.  ASSISTANT:  None.  DESCRIPTION OF PROCEDURE:  Under adequate general anesthesia, the patient was placed in the dorsal lithotomy position.  She had voided prior to entering the room.  She was sterilely prepped and draped in the usual fashion.  A large golf-sized mass  arising lateral and superior to the labia major was noted.  The base was injected with 0.25% Marcaine with epinephrine.  An elliptical incision was made and the lesion was excised with some of the fatty tissue below.  The base was cauterized and closed  with interrupted 2-0 plain sutures and the skin approximated with 4-0 Vicryl subcuticular closure.  Steri-Strips and Benzoin were placed.  SPECIMEN:  Vulvar mass sent to Pathology.  ESTIMATED BLOOD LOSS:  2 mL.  INTRAOPERATIVE FLUIDS:  200 mL.  COMPLICATIONS:  None.  CONDITION:  The patient tolerated the procedure well and was transferred to the recovery room in stable condition.   PUS D: 07/27/2023 11:54:42 am T: 07/27/2023 1:12:00 pm  JOB: [id number]/ [id number]

## 2023-07-27 NOTE — Anesthesia Postprocedure Evaluation (Signed)
Anesthesia Post Note  Patient: Savannah Hart  Procedure(s) Performed: VULVAR LESION     Patient location during evaluation: PACU Anesthesia Type: General Level of consciousness: awake and alert Pain management: pain level controlled Vital Signs Assessment: post-procedure vital signs reviewed and stable Respiratory status: spontaneous breathing, nonlabored ventilation and respiratory function stable Cardiovascular status: stable and blood pressure returned to baseline Anesthetic complications: no   No notable events documented.  Last Vitals:  Vitals:   07/27/23 1130 07/27/23 1145  BP: 112/76 112/77  Pulse: 73 70  Resp: 18 13  Temp:  36.5 C  SpO2: 100% 100%    Last Pain:  Vitals:   07/27/23 1145  TempSrc:   PainSc: 2                  Beryle Lathe

## 2023-07-27 NOTE — H&P (Signed)
Savannah Hart is an 42 y.o. female. [id number] MF presents for excision of large vulvar mass.  Pertinent Gynecological History: Menses: flow is excessive with use of 5 pads or tampons on heaviest days Bleeding: menorrhagia Contraception: none DES exposure: unknown Blood transfusions: none Sexually transmitted diseases: no past history Previous GYN Procedures: DNC  Last mammogram: normal Date: 2024 Last pap: normal Date: 2024 OB History: G3, P2   Menstrual History: Menarche age: n/a Patient's last menstrual period was 07/12/2023 (exact date).    Past Medical History:  Diagnosis Date   Anemia    Scoliosis     Past Surgical History:  Procedure Laterality Date   BACK SURGERY     CESAREAN SECTION     CESAREAN SECTION N/A 03/22/2018   Procedure: Repeat CESAREAN SECTION;  Surgeon: Maxie Better, MD;  Location: [facility];  Service: Obstetrics;  Laterality: N/A;  EDD: 04/01/18   DILATION AND CURETTAGE OF UTERUS      History reviewed. No pertinent family history.  Social History:  reports that she has never smoked. She has never used smokeless tobacco. She reports that she does not drink alcohol and does not use drugs.  Allergies:  Allergies  Allergen Reactions   Tramadol Swelling    Throat swelling    No medications prior to admission.    Review of Systems  All other systems reviewed and are negative.   Height 5\' 2"  (1.575 m), weight 80.3 kg, last menstrual period 07/12/2023, not currently breastfeeding. Physical Exam Constitutional:      Appearance: Normal appearance.  HENT:     Head: Atraumatic.  Eyes:     Extraocular Movements: Extraocular movements intact.  Cardiovascular:     Rate and Rhythm: Regular rhythm.  Pulmonary:     Breath sounds: Normal breath sounds.  Abdominal:     Palpations: Abdomen is soft.     Comments: Pfannenstiel skin incision  Genitourinary:    Comments: Vulva : mons nl Left labia majus large pedunculated mass Cervix  nulliparous Uterus AV Adnexa nl Skin:    General: Skin is warm and dry.  Neurological:     Mental Status: She is alert and oriented to person, place, and time.  Psychiatric:        Behavior: Behavior normal.     No results found for this or any previous visit (from the past 24 hours).  No results found.  Assessment/Plan: Vulvar mass Menorrhagia P) excision of vulvar mass. Risk of surgery reviewed including infection, bleeding, incomplete excision All ? answered  Iviona Hole A Domenico Achord 07/27/2023, 5:11 AM

## 2023-07-27 NOTE — Interval H&P Note (Signed)
History and Physical Interval Note:  07/27/2023 10:12 AM  Savannah Hart  has presented today for surgery, with the diagnosis of Vulvar mass.  The various methods of treatment have been discussed with the patient and family. After consideration of risks, benefits and other options for treatment, the patient has consented to Excision of vulvar mass as a surgical intervention.  The patient's history has been reviewed, patient examined, no change in status, stable for surgery.  I have reviewed the patient's chart and labs.  Questions were answered to the patient's satisfaction.     Jennelle Pinkstaff A Myrlene Riera

## 2023-07-27 NOTE — Brief Op Note (Signed)
07/27/2023  11:39 AM  PATIENT:  Savannah Hart  42 y.o. female  PRE-OPERATIVE DIAGNOSIS:  Vulvar mass  POST-OPERATIVE DIAGNOSIS:  Vulvar mass  PROCEDURE:  excision of vulvar mass  SURGEON:  Surgeons and Role:    Maxie Better, MD - Primary  PHYSICIAN ASSISTANT:   ASSISTANTS: none   ANESTHESIA:   local and general FINDING:  left upper labia majus to mons  golf size mass EBL:  2 mL   BLOOD ADMINISTERED:none  DRAINS: none   LOCAL MEDICATIONS USED:  OTHER marcaine with epinephrine  SPECIMEN:  Source of Specimen:  vulvar mass  DISPOSITION OF SPECIMEN:  PATHOLOGY  COUNTS:  YES  TOURNIQUET:  * No tourniquets in log *  DICTATION: .Other Dictation: Dictation Number [id number]  PLAN OF CARE: Discharge to home after PACU  PATIENT DISPOSITION:  PACU - hemodynamically stable.   Delay start of Pharmacological VTE agent (>24hrs) due to surgical blood loss or risk of bleeding: no

## 2023-07-27 NOTE — Anesthesia Preprocedure Evaluation (Addendum)
Anesthesia Evaluation  Patient identified by MRN, date of birth, ID band Patient awake    Reviewed: Allergy & Precautions, NPO status , Patient's Chart, lab work & pertinent test results  History of Anesthesia Complications Negative for: history of anesthetic complications  Airway Mallampati: III  TM Distance: >3 FB Neck ROM: Full    Dental  (+) Dental Advisory Given   Pulmonary neg pulmonary ROS   Pulmonary exam normal        Cardiovascular negative cardio ROS Normal cardiovascular exam     Neuro/Psych negative neurological ROS  negative psych ROS   GI/Hepatic negative GI ROS, Neg liver ROS,,,  Endo/Other   Obesity   Renal/GU negative Renal ROS     Musculoskeletal  Scoliosis    Abdominal   Peds  Hematology negative hematology ROS (+)   Anesthesia Other Findings   Reproductive/Obstetrics                             Anesthesia Physical Anesthesia Plan  ASA: 2  Anesthesia Plan: General   Post-op Pain Management: Tylenol PO (pre-op)*   Induction: Intravenous  PONV Risk Score and Plan: 3 and Treatment may vary due to age or medical condition, Ondansetron, Dexamethasone and Midazolam  Airway Management Planned: LMA  Additional Equipment: None  Intra-op Plan:   Post-operative Plan: Extubation in OR  Informed Consent: I have reviewed the patients History and Physical, chart, labs and discussed the procedure including the risks, benefits and alternatives for the proposed anesthesia with the patient or authorized representative who has indicated his/her understanding and acceptance.     Dental advisory given  Plan Discussed with: CRNA and Anesthesiologist  Anesthesia Plan Comments:        Anesthesia Quick Evaluation

## 2023-07-27 NOTE — Transfer of Care (Signed)
Immediate Anesthesia Transfer of Care Note  Patient: Savannah Hart  Procedure(s) Performed: VULVAR LESION  Patient Location: PACU  Anesthesia Type:General  Level of Consciousness: drowsy and patient cooperative  Airway & Oxygen Therapy: Patient Spontanous Breathing  Post-op Assessment: Report given to RN and Post -op Vital signs reviewed and stable  Post vital signs: Reviewed and stable  Last Vitals:  Vitals Value Taken Time  BP    Temp    Pulse    Resp    SpO2      Last Pain:  Vitals:   07/27/23 0818  TempSrc: Oral  PainSc: 0-No pain      Patients Stated Pain Goal: 7 (07/27/23 0818)  Complications: No notable events documented.

## 2023-07-27 NOTE — Anesthesia Procedure Notes (Signed)
Procedure Name: LMA Insertion Date/Time: 07/27/2023 10:21 AM  Performed by: Dairl Ponder, CRNAPre-anesthesia Checklist: Patient identified, Emergency Drugs available, Suction available and Patient being monitored Patient Re-evaluated:Patient Re-evaluated prior to induction Oxygen Delivery Method: Circle System Utilized Preoxygenation: Pre-oxygenation with 100% oxygen Induction Type: IV induction Ventilation: Mask ventilation without difficulty LMA: LMA inserted LMA Size: 4.0 Number of attempts: 1 Airway Equipment and Method: Bite block Placement Confirmation: positive ETCO2 Tube secured with: Tape Dental Injury: Teeth and Oropharynx as per pre-operative assessment

## 2023-07-27 NOTE — Discharge Instructions (Addendum)
Post Anesthesia Home Care Instructions  Activity: Get plenty of rest for the remainder of the day. A responsible individual must stay with you for 24 hours following the procedure.  For the next 24 hours, DO NOT: -Drive a car -Advertising copywriter -Drink alcoholic beverages -Take any medication unless instructed by your physician -Make any legal decisions or sign important papers.  Meals: Start with liquid foods such as gelatin or soup. Progress to regular foods as tolerated. Avoid greasy, spicy, heavy foods. If nausea and/or vomiting occur, drink only clear liquids until the nausea and/or vomiting subsides. Call your physician if vomiting continues.  Special Instructions/Symptoms: Your throat may feel dry or sore from the anesthesia or the breathing tube placed in your throat during surgery. If this causes discomfort, gargle with warm salt water. The discomfort should disappear within 24 hours.    No acetaminophen/Tylenol until after 2:30 pm today if needed.  Cold compress around the clock x 24hrs

## 2023-07-28 ENCOUNTER — Encounter (HOSPITAL_BASED_OUTPATIENT_CLINIC_OR_DEPARTMENT_OTHER): Payer: Self-pay | Admitting: Obstetrics and Gynecology

## 2023-07-30 LAB — SURGICAL PATHOLOGY
